# Patient Record
Sex: Female | Born: 1950 | Race: Black or African American | Hispanic: No | Marital: Married | Smoking: Never smoker
Health system: Southern US, Community
[De-identification: ages and names within clinical notes are randomized; demographics above are authoritative.]

## PROBLEM LIST (undated history)

## (undated) DIAGNOSIS — Z9221 Personal history of antineoplastic chemotherapy: Secondary | ICD-10-CM

## (undated) DIAGNOSIS — C801 Malignant (primary) neoplasm, unspecified: Secondary | ICD-10-CM

## (undated) DIAGNOSIS — K219 Gastro-esophageal reflux disease without esophagitis: Secondary | ICD-10-CM

## (undated) DIAGNOSIS — E039 Hypothyroidism, unspecified: Secondary | ICD-10-CM

## (undated) DIAGNOSIS — F419 Anxiety disorder, unspecified: Secondary | ICD-10-CM

## (undated) DIAGNOSIS — Z87442 Personal history of urinary calculi: Secondary | ICD-10-CM

## (undated) DIAGNOSIS — J45909 Unspecified asthma, uncomplicated: Secondary | ICD-10-CM

## (undated) DIAGNOSIS — G4730 Sleep apnea, unspecified: Secondary | ICD-10-CM

## (undated) DIAGNOSIS — E042 Nontoxic multinodular goiter: Secondary | ICD-10-CM

## (undated) DIAGNOSIS — D649 Anemia, unspecified: Secondary | ICD-10-CM

## (undated) DIAGNOSIS — Z5189 Encounter for other specified aftercare: Secondary | ICD-10-CM

## (undated) DIAGNOSIS — C50919 Malignant neoplasm of unspecified site of unspecified female breast: Secondary | ICD-10-CM

## (undated) DIAGNOSIS — I10 Essential (primary) hypertension: Secondary | ICD-10-CM

## (undated) HISTORY — PX: CHOLECYSTECTOMY: SHX55

## (undated) HISTORY — DX: Encounter for other specified aftercare: Z51.89

## (undated) HISTORY — PX: LAPAROSCOPIC GASTRIC SLEEVE RESECTION: SHX5895

## (undated) HISTORY — PX: COLONOSCOPY: SHX174

## (undated) HISTORY — PX: ESOPHAGOGASTRODUODENOSCOPY: SHX1529

## (undated) HISTORY — PX: BREAST REDUCTION SURGERY: SHX8

## (undated) HISTORY — DX: Sleep apnea, unspecified: G47.30

## (undated) HISTORY — PX: PORTACATH PLACEMENT: SHX2246

## (undated) HISTORY — DX: Anemia, unspecified: D64.9

## (undated) HISTORY — PX: THYROIDECTOMY, PARTIAL: SHX18

## (undated) HISTORY — PX: UPPER GASTROINTESTINAL ENDOSCOPY: SHX188

## (undated) HISTORY — DX: Anxiety disorder, unspecified: F41.9

## (undated) HISTORY — DX: Essential (primary) hypertension: I10

## (undated) HISTORY — DX: Malignant neoplasm of unspecified site of unspecified female breast: C50.919

## (undated) HISTORY — PX: ABDOMINAL HYSTERECTOMY: SHX81

## (undated) HISTORY — PX: GANGLION CYST EXCISION: SHX1691

## (undated) HISTORY — PX: AXILLARY LYMPH NODE DISSECTION: SHX5229

## (undated) HISTORY — PX: OTHER SURGICAL HISTORY: SHX169

## (undated) HISTORY — PX: HERNIA REPAIR: SHX51

## (undated) HISTORY — DX: Malignant (primary) neoplasm, unspecified: C80.1

---

## 2016-09-11 DIAGNOSIS — G8191 Hemiplegia, unspecified affecting right dominant side: Secondary | ICD-10-CM | POA: Diagnosis not present

## 2016-09-11 DIAGNOSIS — E039 Hypothyroidism, unspecified: Secondary | ICD-10-CM | POA: Diagnosis not present

## 2016-09-11 DIAGNOSIS — I10 Essential (primary) hypertension: Secondary | ICD-10-CM | POA: Diagnosis not present

## 2016-09-11 DIAGNOSIS — C50019 Malignant neoplasm of nipple and areola, unspecified female breast: Secondary | ICD-10-CM | POA: Diagnosis not present

## 2016-09-11 DIAGNOSIS — M130 Polyarthritis, unspecified: Secondary | ICD-10-CM | POA: Diagnosis not present

## 2016-09-11 DIAGNOSIS — M818 Other osteoporosis without current pathological fracture: Secondary | ICD-10-CM | POA: Diagnosis not present

## 2016-09-12 DIAGNOSIS — M818 Other osteoporosis without current pathological fracture: Secondary | ICD-10-CM | POA: Diagnosis not present

## 2016-09-12 DIAGNOSIS — C50019 Malignant neoplasm of nipple and areola, unspecified female breast: Secondary | ICD-10-CM | POA: Diagnosis not present

## 2016-09-12 DIAGNOSIS — M130 Polyarthritis, unspecified: Secondary | ICD-10-CM | POA: Diagnosis not present

## 2016-09-12 DIAGNOSIS — I10 Essential (primary) hypertension: Secondary | ICD-10-CM | POA: Diagnosis not present

## 2016-09-12 DIAGNOSIS — E039 Hypothyroidism, unspecified: Secondary | ICD-10-CM | POA: Diagnosis not present

## 2016-09-12 DIAGNOSIS — C8191 Hodgkin lymphoma, unspecified, lymph nodes of head, face, and neck: Secondary | ICD-10-CM | POA: Diagnosis not present

## 2016-09-17 DIAGNOSIS — M818 Other osteoporosis without current pathological fracture: Secondary | ICD-10-CM | POA: Diagnosis not present

## 2016-09-17 DIAGNOSIS — C50019 Malignant neoplasm of nipple and areola, unspecified female breast: Secondary | ICD-10-CM | POA: Diagnosis not present

## 2016-09-17 DIAGNOSIS — I10 Essential (primary) hypertension: Secondary | ICD-10-CM | POA: Diagnosis not present

## 2016-09-17 DIAGNOSIS — E039 Hypothyroidism, unspecified: Secondary | ICD-10-CM | POA: Diagnosis not present

## 2016-09-17 DIAGNOSIS — C8191 Hodgkin lymphoma, unspecified, lymph nodes of head, face, and neck: Secondary | ICD-10-CM | POA: Diagnosis not present

## 2016-09-17 DIAGNOSIS — M130 Polyarthritis, unspecified: Secondary | ICD-10-CM | POA: Diagnosis not present

## 2016-09-19 ENCOUNTER — Telehealth: Payer: Self-pay | Admitting: Hematology and Oncology

## 2016-09-19 NOTE — Telephone Encounter (Signed)
Pt confirmed appt Called [facility]) to obtain [facility] (Dr. Humphrey Rolls) [phone] to request patient records and scans to be faxed [facility]

## 2016-09-20 ENCOUNTER — Other Ambulatory Visit: Payer: Self-pay | Admitting: *Deleted

## 2016-09-20 ENCOUNTER — Ambulatory Visit (HOSPITAL_COMMUNITY)
Admission: RE | Admit: 2016-09-20 | Discharge: 2016-09-20 | Disposition: A | Discharge status: Home / Self Care | Payer: Commercial Managed Care - HMO | Source: Ambulatory Visit | Attending: Hematology and Oncology | Admitting: Hematology and Oncology

## 2016-09-20 ENCOUNTER — Ambulatory Visit (HOSPITAL_BASED_OUTPATIENT_CLINIC_OR_DEPARTMENT_OTHER): Payer: Commercial Managed Care - HMO

## 2016-09-20 ENCOUNTER — Encounter: Payer: Self-pay | Admitting: Hematology and Oncology

## 2016-09-20 ENCOUNTER — Telehealth: Payer: Self-pay | Admitting: Hematology and Oncology

## 2016-09-20 ENCOUNTER — Ambulatory Visit (HOSPITAL_BASED_OUTPATIENT_CLINIC_OR_DEPARTMENT_OTHER): Payer: Commercial Managed Care - HMO | Admitting: Hematology and Oncology

## 2016-09-20 DIAGNOSIS — Z8509 Personal history of malignant neoplasm of other digestive organs: Secondary | ICD-10-CM | POA: Diagnosis not present

## 2016-09-20 DIAGNOSIS — C8198 Hodgkin lymphoma, unspecified, lymph nodes of multiple sites: Secondary | ICD-10-CM

## 2016-09-20 DIAGNOSIS — Z452 Encounter for adjustment and management of vascular access device: Secondary | ICD-10-CM | POA: Insufficient documentation

## 2016-09-20 DIAGNOSIS — Z853 Personal history of malignant neoplasm of breast: Secondary | ICD-10-CM

## 2016-09-20 DIAGNOSIS — Z9689 Presence of other specified functional implants: Secondary | ICD-10-CM | POA: Diagnosis not present

## 2016-09-20 LAB — CBC WITH DIFFERENTIAL/PLATELET
BASO%: 0.4 % (ref 0.0–2.0)
BASOS ABS: 0 10*3/uL (ref 0.0–0.1)
EOS ABS: 0.1 10*3/uL (ref 0.0–0.5)
EOS%: 1.9 % (ref 0.0–7.0)
HCT: 35.7 % (ref 34.8–46.6)
HEMOGLOBIN: 11.6 g/dL (ref 11.6–15.9)
LYMPH%: 27.4 % (ref 14.0–49.7)
MCH: 30.3 pg (ref 25.1–34.0)
MCHC: 32.5 g/dL (ref 31.5–36.0)
MCV: 93.2 fL (ref 79.5–101.0)
MONO#: 0.4 10*3/uL (ref 0.1–0.9)
MONO%: 6.3 % (ref 0.0–14.0)
NEUT#: 4.5 10*3/uL (ref 1.5–6.5)
NEUT%: 64 % (ref 38.4–76.8)
Platelets: 286 10*3/uL (ref 145–400)
RBC: 3.83 10*6/uL (ref 3.70–5.45)
RDW: 13.2 % (ref 11.2–14.5)
WBC: 7 10*3/uL (ref 3.9–10.3)
lymph#: 1.9 10*3/uL (ref 0.9–3.3)

## 2016-09-20 LAB — COMPREHENSIVE METABOLIC PANEL
ALBUMIN: 3.9 g/dL (ref 3.5–5.0)
ALT: 10 U/L (ref 0–55)
ANION GAP: 8 meq/L (ref 3–11)
AST: 18 U/L (ref 5–34)
Alkaline Phosphatase: 104 U/L (ref 40–150)
BILIRUBIN TOTAL: 0.73 mg/dL (ref 0.20–1.20)
BUN: 11.8 mg/dL (ref 7.0–26.0)
CALCIUM: 9.3 mg/dL (ref 8.4–10.4)
CHLORIDE: 107 meq/L (ref 98–109)
CO2: 26 meq/L (ref 22–29)
CREATININE: 0.7 mg/dL (ref 0.6–1.1)
EGFR: >90 mL/min/{1.73_m2} (ref >90)
Glucose: 110 mg/dl (ref 70–140)
Potassium: 3.5 mEq/L (ref 3.5–5.1)
SODIUM: 142 meq/L (ref 136–145)
Total Protein: 7.4 g/dL (ref 6.4–8.3)

## 2016-09-20 LAB — LACTATE DEHYDROGENASE: LDH: 198 U/L (ref 125–245)

## 2016-09-20 MED ORDER — HEPARIN SOD (PORK) LOCK FLUSH 100 UNIT/ML IV SOLN
500.0000 [IU] | Freq: Once | INTRAVENOUS | Status: AC
  Administered 2016-09-20: 500 [IU] via INTRAVENOUS
  Filled 2016-09-20: qty 5

## 2016-09-20 MED ORDER — LIDOCAINE-PRILOCAINE 2.5-2.5 % EX CREA
TOPICAL_CREAM | CUTANEOUS | Status: AC
  Filled 2016-09-20: qty 5

## 2016-09-20 MED ORDER — SODIUM CHLORIDE 0.9% FLUSH
10.0000 mL | INTRAVENOUS | Status: DC | PRN
  Administered 2016-09-20: 10 mL via INTRAVENOUS
  Filled 2016-09-20: qty 10

## 2016-09-20 NOTE — Telephone Encounter (Signed)
2 bottles of contrast given per CT orders. Appointments scheduled per 09/20/16 los. Patient was given a copy of the appointment schedule and AVS report, per 09/20/16 los.

## 2016-09-24 ENCOUNTER — Encounter: Payer: Self-pay | Admitting: Hematology and Oncology

## 2016-09-24 DIAGNOSIS — Z853 Personal history of malignant neoplasm of breast: Secondary | ICD-10-CM | POA: Insufficient documentation

## 2016-09-24 DIAGNOSIS — Z8509 Personal history of malignant neoplasm of other digestive organs: Secondary | ICD-10-CM | POA: Insufficient documentation

## 2016-09-24 NOTE — Assessment & Plan Note (Addendum)
She had history of GIST tumor, status post submucosal resection in 2014 when EGD show mucosa nodule in the proximal stomach. She should undergo surveillance EGD in the future even if CT scan is negative

## 2016-09-24 NOTE — Assessment & Plan Note (Signed)
Clinically, she has no signs of cancer recurrence. I plan to order a CT scan of the chest, abdomen and pelvis to exclude persistent disease. If she is cancer free, I will get the port removed. We will draw baseline blood work and flush her port today.

## 2016-09-24 NOTE — Progress Notes (Signed)
[facility] CONSULT NOTE  Patient Care Team: Lucianne Lei, MD as PCP - General (Family Medicine)  CHIEF COMPLAINTS/PURPOSE OF CONSULTATION:  History of Hodgkin lymphoma, breast cancer and GIST tumor  HISTORY OF PRESENTING ILLNESS:  Andrea Mendez 65 y.o. female is here because of multiple cancer history. 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 Staging scans showed adenopathy in the left internal jugular chain, mediastinum, peritoneum, moderate pericardial effusion along with cardiomegaly      03/01/2013 - 08/19/2013 Chemotherapy    The patient had 6 cycles of ABVD in [place]      01/31/2014 Imaging    Outside CT scan of the chest, abdomen and pelvis showed reduction in size of lymphadenopathy      03/03/2014 Imaging    Outside CT that showed no evidence of lymphadenopathy, 6 mm left thyroid nodule and postoperative partial resection of right lobe of thyroid      06/07/2014 PET scan    Outside PET scan showed no evidence of cancer recurrence      09/29/2014 Imaging    Outside CT scan of the neck, chest, abdomen and pelvis showed reduction in the size of mediastinal lymph nodes and stable sclerotic focus in L1 vertebral body      02/01/2015 PET scan    Outside PET scan show no evidence of disease      08/09/2015 PET scan    Outside PET scan showed no evidence of disease      11/16/2015 Imaging    Outside screening left breast mammogram showed asymmetric density in the upper inner left breast, spot compression view recommended ultrasound      12/04/2015 Imaging    Outside ultrasound of the left breast showed no masses.      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 The patient relocated to [place] in 2017 to be closer to family. Prior to that, she has lived in [place] for 21 years. Her cancer history started when she was 31 when she had palpated a lump on the left breast leading to mastectomy, chemotherapy and anti-estrogen therapy. The patient was pre-menopausal at the time of diagnosis of breast cancer. She had menarche at the age of 54, had her first child when she was 52 years old, became menopausal at age 46. She had been exposed to birth control pills for approximately 10 years in total. She had 3 children but did not breast-feed any of her children. She has regular surveillance screening mammogram on the left breast. She tested negative for BRCA mutation in 2016. In terms of her GIST tumor diagnosis, it was discovered incidentally on EGD. In terms of Hodgkin lymphoma, the patient had profound weight loss in 2014 with associated skin itching. She underwent further evaluation and was subsequently diagnosed with stage III Hodgkin lymphoma. The chemotherapy treatment was complicated by some mild peripheral neuropathy and pancytopenia requiring blood transfusion. She still has a port in place She feels well. No recent lymphadenopathy.  Appetite is stable. No further weight loss.  MEDICAL HISTORY:  Past Medical History:  Diagnosis Date  . Anemia   . Anxiety   . Blood transfusion without reported diagnosis   . Breast cancer ([place])    right breast  . Cancer ([place])    Hodgkin lymphoma  . Hypertension     SURGICAL HISTORY: Past Surgical History:  Procedure Laterality Date  . ABDOMINAL HYSTERECTOMY    . APPENDECTOMY    . AXILLARY LYMPH NODE DISSECTION Right    1990  . BREAST REDUCTION SURGERY Left   . COLONOSCOPY    . ESOPHAGOGASTRODUODENOSCOPY    . LAPAROSCOPIC  GASTRIC SLEEVE RESECTION    . right mastectomy Right    1990  . THYROIDECTOMY, PARTIAL      SOCIAL HISTORY: Social History   Social History  . Marital status: Married    Spouse name: N/A  . Number of children: 3  . Years of education: N/A   Occupational History  . retired [employer]    Social History Main Topics  . Smoking status: Never Smoker  . Smokeless tobacco: Never Used  . Alcohol use 3.0 oz/week    5 Glasses of wine per week  . Drug use: No  . Sexual activity: Not on file   Other Topics Concern  . Not on file   Social History Narrative  . No narrative on file    FAMILY HISTORY: Family History  Problem Relation Age of Onset  . Cancer Mother 31    colon ca  . Cancer Father 39    prostate ca    ALLERGIES:  has No Known Allergies.  MEDICATIONS:  Current Outpatient Prescriptions  Medication Sig Dispense Refill  . alendronate (FOSAMAX) 35 MG tablet Take 35 mg by mouth every 7 (seven) days. Take with a full glass of water on an empty stomach.    . ALPRAZolam (XANAX) 0.25 MG tablet Take 0.25 mg by mouth at bedtime as needed for anxiety.    Marland Kitchen amLODipine (NORVASC) 10 MG tablet     . Calcium Carb-Cholecalciferol (CALCIUM 1000 + D) 1000-800 MG-UNIT TABS Take by mouth.    . cholecalciferol (VITAMIN D) 1000 units tablet Take 1,000 Units by mouth daily.    Marland Kitchen levothyroxine (SYNTHROID, LEVOTHROID) 100 MCG tablet     . Melatonin 3 MG CAPS Take by mouth.    . metoprolol (LOPRESSOR) 50 MG tablet     . Multiple Vitamin (MULTIVITAMIN) tablet Take 1 tablet by mouth daily.     No current facility-administered medications for this visit.     REVIEW OF SYSTEMS:   Constitutional: Denies fevers, chills or abnormal night sweats Eyes: Denies blurriness of vision, double vision or watery eyes Ears, nose, mouth, throat, and face: Denies mucositis or sore throat Respiratory: Denies cough, dyspnea or wheezes Cardiovascular: Denies palpitation, chest discomfort or lower  extremity swelling Gastrointestinal:  Denies nausea, heartburn or change in bowel habits Skin: Denies abnormal skin rashes Lymphatics: Denies new lymphadenopathy or easy bruising Neurological:Denies numbness, tingling or new weaknesses Behavioral/Psych: Mood is stable, no new changes  All other systems were reviewed with the patient and are negative.  PHYSICAL EXAMINATION: ECOG PERFORMANCE STATUS: 1 - Symptomatic but completely ambulatory  Vitals:   09/20/16 1029  BP: (!) 144/74  Pulse: 99  Resp: 19  Temp: 97.9 F (36.6 C)   Filed Weights   09/20/16 1029  Weight: 207 lb 11.2 oz (94.2 kg)    GENERAL:alert, no distress and comfortable SKIN:  skin color, texture, turgor are normal, no rashes or significant lesions EYES: normal, conjunctiva are pink and non-injected, sclera clear OROPHARYNX:no exudate, no erythema and lips, buccal mucosa, and tongue normal  NECK: supple, No mild thyroid enlargement. Well-healed surgical scar  LYMPH:  no palpable lymphadenopathy in the cervical, axillary or inguinal LUNGS: clear to auscultation and percussion with normal breathing effort HEART: regular rate & rhythm and no murmurs and no lower extremity edema ABDOMEN:abdomen soft, non-tender and normal bowel sounds Musculoskeletal:no cyanosis of digits and no clubbing  PSYCH: alert & oriented x 3 with fluent speech NEURO: no focal motor/sensory deficits Chest wall examination revealed right mastectomy scar. No abnormalities on exam on either chest wall  LABORATORY DATA:  I have reviewed the data as listed Lab Results  Component Value Date   WBC 7.0 09/20/2016   HGB 11.6 09/20/2016   HCT 35.7 09/20/2016   MCV 93.2 09/20/2016   PLT 286 09/20/2016    Recent Labs  09/20/16 1205  NA 142  K 3.5  CO2 26  GLUCOSE 110  BUN 11.8  CREATININE 0.7  CALCIUM 9.3  PROT 7.4  ALBUMIN 3.9  AST 18  ALT 10  ALKPHOS 104  BILITOT 0.73    RADIOGRAPHIC STUDIES: I have personally reviewed the  radiological images as listed and agreed with the findings in the report. Dg Chest 1 View  Result Date: 09/20/2016 CLINICAL DATA:  Port-A-Cath placement positioning. EXAM: CHEST 1 VIEW COMPARISON:  None. FINDINGS: Injectable port is seen within the right chest wall with tip at the expected location of the cavoatrial junction. Cardiomediastinal silhouette is normal. Mediastinal contours appear intact. There is no evidence of focal airspace consolidation, pleural effusion or pneumothorax. Soft tissue densities project at the right cardio phrenic angle and right peritracheal location slightly above the carina. Osseous structures are without acute abnormality. Soft tissues are grossly normal. IMPRESSION: Injectable port with tip at the expected location of the cavoatrial junction. Soft tissue densities project at the right cardiophrenic angle and right peritracheal location. These may represent overlapping shadows on this single frontal view of the chest, however pulmonary masses or lymphadenopathy cannot be excluded. Electronically Signed   By: Fidela Salisbury M.D.   On: 09/20/2016 12:20    ASSESSMENT & PLAN:  Hodgkin lymphoma of lymph nodes of multiple sites [facility]) Clinically, she has no signs of cancer recurrence. I plan to order a CT scan of the chest, abdomen and pelvis to exclude persistent disease. If she is cancer free, I will get the port removed. We will draw baseline blood work and flush her port today.  History of right breast cancer The patient have a remote history of right breast cancer status post mastectomy in 1990, adjuvant chemotherapy, and tamoxifen She was premenopausal at diagnosis. Subsequently, she has hysterectomy and bilateral salpingo-oophorectomy. She had BRCA genetic testing in 2016 with negative results. She will continue screening mammogram on the left breast  History of gastrointestinal stromal tumor (GIST) She had history of GIST tumor, status post submucosal  resection in 2014 when EGD show mucosa nodule in the proximal stomach. She should undergo surveillance EGD in the future even if CT scan is negative  Orders Placed This Encounter  Procedures  . CT ABDOMEN PELVIS W CONTRAST    Standing Status:   Future    Standing Expiration Date:   10/25/2017    Order Specific Question:   Reason for exam:    Answer:   staging lymphoma, exclude recurrence  Order Specific Question:   Preferred imaging location?    Answer:   [facility]    Standing Status:   Future    Standing Expiration Date:   10/25/2017    Scheduling Instructions:     Call cell phone ifrst    Order Specific Question:   Reason for exam:    Answer:   staging lymphoma, exclude recurrence    Order Specific Question:   Preferred imaging location?    Answer:   [facility]  . DG Chest 1 View    Standing Status:   Future    Number of Occurrences:   1    Standing Expiration Date:   09/20/2017    Order Specific Question:   Reason for Exam (SYMPTOM  OR DIAGNOSIS REQUIRED)    Answer:   confirm position of port    Order Specific Question:   Preferred imaging location?    Answer:   [facility]  . CBC with Differential/Platelet    Standing Status:   Standing    Number of Occurrences:   3    Standing Expiration Date:   09/20/2017  . Comprehensive metabolic panel    Standing Status:   Standing    Number of Occurrences:   3    Standing Expiration Date:   09/20/2017  . Lactate dehydrogenase    Standing Status:   Standing    Number of Occurrences:   3    Standing Expiration Date:   09/20/2017     All questions were answered. The patient knows to call the clinic with any problems, questions or concerns. I spent 60 minutes counseling the patient face to face. The total time spent in the appointment was 80 minutes and more than 50% was on counseling.     Heath Lark, MD 09/24/2016 8:13 AM

## 2016-09-24 NOTE — Assessment & Plan Note (Addendum)
The patient have a remote history of right breast cancer status post mastectomy in 1990, adjuvant chemotherapy, and tamoxifen She was premenopausal at diagnosis. Subsequently, she has hysterectomy and bilateral salpingo-oophorectomy. She had BRCA genetic testing in 2016 with negative results. She will continue screening mammogram on the left breast

## 2016-10-03 ENCOUNTER — Other Ambulatory Visit: Payer: Self-pay | Admitting: Internal Medicine

## 2016-10-03 DIAGNOSIS — E041 Nontoxic single thyroid nodule: Secondary | ICD-10-CM

## 2016-10-03 DIAGNOSIS — E039 Hypothyroidism, unspecified: Secondary | ICD-10-CM | POA: Diagnosis not present

## 2016-10-04 ENCOUNTER — Telehealth: Payer: Self-pay | Admitting: *Deleted

## 2016-10-04 ENCOUNTER — Encounter (HOSPITAL_COMMUNITY): Payer: Self-pay

## 2016-10-04 ENCOUNTER — Ambulatory Visit (HOSPITAL_COMMUNITY)
Admission: RE | Admit: 2016-10-04 | Discharge: 2016-10-04 | Disposition: A | Discharge status: Home / Self Care | Payer: Commercial Managed Care - HMO | Source: Ambulatory Visit | Attending: Hematology and Oncology | Admitting: Hematology and Oncology

## 2016-10-04 DIAGNOSIS — Z9049 Acquired absence of other specified parts of digestive tract: Secondary | ICD-10-CM | POA: Diagnosis not present

## 2016-10-04 DIAGNOSIS — Z8500 Personal history of malignant neoplasm of unspecified digestive organ: Secondary | ICD-10-CM | POA: Insufficient documentation

## 2016-10-04 DIAGNOSIS — C8198 Hodgkin lymphoma, unspecified, lymph nodes of multiple sites: Secondary | ICD-10-CM | POA: Insufficient documentation

## 2016-10-04 DIAGNOSIS — M899 Disorder of bone, unspecified: Secondary | ICD-10-CM | POA: Insufficient documentation

## 2016-10-04 DIAGNOSIS — Z9889 Other specified postprocedural states: Secondary | ICD-10-CM | POA: Insufficient documentation

## 2016-10-04 DIAGNOSIS — I700 Atherosclerosis of aorta: Secondary | ICD-10-CM | POA: Insufficient documentation

## 2016-10-04 DIAGNOSIS — Z90710 Acquired absence of both cervix and uterus: Secondary | ICD-10-CM | POA: Insufficient documentation

## 2016-10-04 DIAGNOSIS — C8590 Non-Hodgkin lymphoma, unspecified, unspecified site: Secondary | ICD-10-CM | POA: Diagnosis not present

## 2016-10-04 MED ORDER — IOPAMIDOL (ISOVUE-300) INJECTION 61%
100.0000 mL | Freq: Once | INTRAVENOUS | Status: AC | PRN
  Administered 2016-10-04: 100 mL via INTRAVENOUS

## 2016-10-04 NOTE — Telephone Encounter (Signed)
-----   Message from Heath Lark, MD sent at 10/04/2016  3:43 PM EST ----- Regarding: CT report Can you call her and let her know CT showed no evidence of cancer Would she be OK for her port to be removed? If so, we need to arrange this with IR (it was placed in [facility])

## 2016-10-04 NOTE — Telephone Encounter (Signed)
Pt notified of CT scan results. Wants to have port removed.  LM for IR for removal

## 2016-10-07 ENCOUNTER — Telehealth: Payer: Self-pay | Admitting: *Deleted

## 2016-10-07 DIAGNOSIS — C8198 Hodgkin lymphoma, unspecified, lymph nodes of multiple sites: Secondary | ICD-10-CM

## 2016-10-07 NOTE — Telephone Encounter (Signed)
VM from [facility] in IR states needs Order for [facility] removal entered for IR.  Order placed.

## 2016-10-08 DIAGNOSIS — I10 Essential (primary) hypertension: Secondary | ICD-10-CM | POA: Diagnosis not present

## 2016-10-08 DIAGNOSIS — M818 Other osteoporosis without current pathological fracture: Secondary | ICD-10-CM | POA: Diagnosis not present

## 2016-10-08 DIAGNOSIS — G8191 Hemiplegia, unspecified affecting right dominant side: Secondary | ICD-10-CM | POA: Diagnosis not present

## 2016-10-08 DIAGNOSIS — M130 Polyarthritis, unspecified: Secondary | ICD-10-CM | POA: Diagnosis not present

## 2016-10-11 ENCOUNTER — Other Ambulatory Visit: Payer: Self-pay | Admitting: Radiology

## 2016-10-14 ENCOUNTER — Ambulatory Visit (HOSPITAL_COMMUNITY)
Admission: RE | Admit: 2016-10-14 | Discharge: 2016-10-14 | Disposition: A | Discharge status: Home / Self Care | Payer: Commercial Managed Care - HMO | Source: Ambulatory Visit | Attending: Hematology and Oncology | Admitting: Hematology and Oncology

## 2016-10-14 ENCOUNTER — Encounter (HOSPITAL_COMMUNITY): Payer: Self-pay | Admitting: *Deleted

## 2016-10-14 DIAGNOSIS — Z800 Family history of malignant neoplasm of digestive organs: Secondary | ICD-10-CM | POA: Insufficient documentation

## 2016-10-14 DIAGNOSIS — Z452 Encounter for adjustment and management of vascular access device: Secondary | ICD-10-CM | POA: Diagnosis not present

## 2016-10-14 DIAGNOSIS — Z9884 Bariatric surgery status: Secondary | ICD-10-CM | POA: Insufficient documentation

## 2016-10-14 DIAGNOSIS — Z9221 Personal history of antineoplastic chemotherapy: Secondary | ICD-10-CM | POA: Insufficient documentation

## 2016-10-14 DIAGNOSIS — Z8042 Family history of malignant neoplasm of prostate: Secondary | ICD-10-CM | POA: Insufficient documentation

## 2016-10-14 DIAGNOSIS — I10 Essential (primary) hypertension: Secondary | ICD-10-CM | POA: Insufficient documentation

## 2016-10-14 DIAGNOSIS — Z8572 Personal history of non-Hodgkin lymphomas: Secondary | ICD-10-CM | POA: Insufficient documentation

## 2016-10-14 DIAGNOSIS — Z853 Personal history of malignant neoplasm of breast: Secondary | ICD-10-CM | POA: Insufficient documentation

## 2016-10-14 DIAGNOSIS — F419 Anxiety disorder, unspecified: Secondary | ICD-10-CM | POA: Insufficient documentation

## 2016-10-14 DIAGNOSIS — D649 Anemia, unspecified: Secondary | ICD-10-CM | POA: Insufficient documentation

## 2016-10-14 DIAGNOSIS — C8198 Hodgkin lymphoma, unspecified, lymph nodes of multiple sites: Secondary | ICD-10-CM

## 2016-10-14 HISTORY — PX: IR GENERIC HISTORICAL: IMG1180011

## 2016-10-14 HISTORY — DX: Personal history of antineoplastic chemotherapy: Z92.21

## 2016-10-14 LAB — CBC WITH DIFFERENTIAL/PLATELET
Basophils Absolute: 0 10*3/uL (ref 0.0–0.1)
Basophils Relative: 0 %
EOS PCT: 2 %
Eosinophils Absolute: 0.2 10*3/uL (ref 0.0–0.7)
HEMATOCRIT: 35.8 % — AB (ref 36.0–46.0)
Hemoglobin: 11.8 g/dL — ABNORMAL LOW (ref 12.0–15.0)
LYMPHS ABS: 2.4 10*3/uL (ref 0.7–4.0)
LYMPHS PCT: 30 %
MCH: 30.9 pg (ref 26.0–34.0)
MCHC: 33 g/dL (ref 30.0–36.0)
MCV: 93.7 fL (ref 78.0–100.0)
MONO ABS: 0.5 10*3/uL (ref 0.1–1.0)
MONOS PCT: 6 %
NEUTROS ABS: 4.8 10*3/uL (ref 1.7–7.7)
Neutrophils Relative %: 62 %
PLATELETS: 348 10*3/uL (ref 150–400)
RBC: 3.82 MIL/uL — ABNORMAL LOW (ref 3.87–5.11)
RDW: 13.4 % (ref 11.5–15.5)
WBC: 7.9 10*3/uL (ref 4.0–10.5)

## 2016-10-14 LAB — PROTIME-INR
INR: 0.97
Prothrombin Time: 12.9 seconds (ref 11.4–15.2)

## 2016-10-14 MED ORDER — LIDOCAINE-EPINEPHRINE (PF) 2 %-1:200000 IJ SOLN
INTRAMUSCULAR | Status: AC | PRN
  Administered 2016-10-14: 20 mL

## 2016-10-14 MED ORDER — FENTANYL CITRATE (PF) 100 MCG/2ML IJ SOLN
INTRAMUSCULAR | Status: AC | PRN
  Administered 2016-10-14: 50 ug via INTRAVENOUS
  Administered 2016-10-14: 25 ug via INTRAVENOUS

## 2016-10-14 MED ORDER — LIDOCAINE-EPINEPHRINE (PF) 2 %-1:200000 IJ SOLN
INTRAMUSCULAR | Status: AC
  Filled 2016-10-14: qty 20

## 2016-10-14 MED ORDER — MIDAZOLAM HCL 2 MG/2ML IJ SOLN
INTRAMUSCULAR | Status: AC
  Filled 2016-10-14: qty 6

## 2016-10-14 MED ORDER — CEFAZOLIN SODIUM-DEXTROSE 2-4 GM/100ML-% IV SOLN
2.0000 g | INTRAVENOUS | Status: DC
Start: 2016-10-14 — End: 2016-10-15
  Filled 2016-10-14: qty 100

## 2016-10-14 MED ORDER — SODIUM CHLORIDE 0.9 % IV SOLN
INTRAVENOUS | Status: DC
  Administered 2016-10-14: 12:00:00 via INTRAVENOUS

## 2016-10-14 MED ORDER — MIDAZOLAM HCL 2 MG/2ML IJ SOLN
INTRAMUSCULAR | Status: AC | PRN
  Administered 2016-10-14: 1 mg via INTRAVENOUS
  Administered 2016-10-14: 0.5 mg via INTRAVENOUS

## 2016-10-14 MED ORDER — FENTANYL CITRATE (PF) 100 MCG/2ML IJ SOLN
INTRAMUSCULAR | Status: AC
  Filled 2016-10-14: qty 4

## 2016-10-14 NOTE — Procedures (Signed)
R port removal No complication No blood loss. See complete dictation in [facility].

## 2016-10-14 NOTE — Consult Note (Signed)
Chief Complaint: Patient was seen in consultation today for port a cath removal  Referring Physician(s): Gorsuch,Ni  Supervising Physician: Arne Cleveland  Patient Status: [place]  History of Present Illness: Andrea Mendez is a 66 y.o. female with remote hx right breast cancer 1990's, GIST with prior resection 2014 and most recently Hodgkin's lymphoma 2014. She had a right chest wall port a cath placed in [place] several years ago. Her most recent CT revealed no evidence of recurrent disease. She presents today for port a cath removal.   Past Medical History:  Diagnosis Date  . Anemia   . Anxiety   . Blood transfusion without reported diagnosis   . Breast cancer ([place])    right breast  . Cancer ([place])    Hodgkin lymphoma  . History of chemotherapy   . Hypertension     Past Surgical History:  Procedure Laterality Date  . ABDOMINAL HYSTERECTOMY    . APPENDECTOMY    . AXILLARY LYMPH NODE DISSECTION Right    1990  . BREAST REDUCTION SURGERY Left   . CHOLECYSTECTOMY    . COLONOSCOPY    . ESOPHAGOGASTRODUODENOSCOPY    . LAPAROSCOPIC GASTRIC SLEEVE RESECTION    . PORTACATH PLACEMENT     2014  . right mastectomy Right    1990  . THYROIDECTOMY, PARTIAL      Allergies: Patient has no known allergies.  Medications: Prior to Admission medications   Medication Sig Start Date End Date Taking? Authorizing Provider  alendronate (FOSAMAX) 35 MG tablet Take 35 mg by mouth every 7 (seven) days. Take with a full glass of water on an empty stomach.   Yes Historical Provider, MD  ALPRAZolam Duanne Moron) 0.25 MG tablet Take 0.25 mg by mouth at bedtime as needed for anxiety.   Yes Historical Provider, MD  amLODipine (NORVASC) 10 MG tablet  07/03/16  Yes Historical Provider, MD  Calcium Carb-Cholecalciferol (CALCIUM 1000 + D) 1000-800 MG-UNIT TABS Take by mouth.   Yes Historical Provider, MD  cholecalciferol (VITAMIN D) 1000 units tablet Take 1,000 Units by mouth daily.   Yes  Historical Provider, MD  diclofenac sodium (VOLTAREN) 1 % GEL Apply 2 g topically 4 (four) times daily as needed.   Yes Historical Provider, MD  levothyroxine (SYNTHROID, LEVOTHROID) 100 MCG tablet  07/03/16  Yes Historical Provider, MD  Melatonin 3 MG CAPS Take by mouth.   Yes Historical Provider, MD  metoprolol (LOPRESSOR) 50 MG tablet  07/03/16  Yes Historical Provider, MD  Multiple Vitamin (MULTIVITAMIN) tablet Take 1 tablet by mouth daily.   Yes Historical Provider, MD     Family History  Problem Relation Age of Onset  . Cancer Mother 43    colon ca  . Cancer Father 62    prostate ca    Social History   Social History  . Marital status: Married    Spouse name: N/A  . Number of children: 3  . Years of education: N/A   Occupational History  . retired [employer]    Social History Main Topics  . Smoking status: Never Smoker  . Smokeless tobacco: Never Used  . Alcohol use 3.0 oz/week    5 Glasses of wine per week     Comment: occas glass of wine  . Drug use: No  . Sexual activity: Not Asked   Other Topics Concern  . None   Social History Narrative  . None      Review of Systems :denies fever, HA,CP,dyspnea,  cough, abd/back pain,N/V or bleeding  Vital Signs: BP 140/85 (BP Location: Left Arm)   Pulse 85   Temp 97.8 F (36.6 C) (Oral)   Resp 18   Ht 5\' 3"  (1.6 m)   Wt 208 lb (94.3 kg)   SpO2 100%   BMI 36.85 kg/m   Physical Exam awake/alert; chest- CTA bilat; clean, intact,NT rt chest wall PAC;heart- RRR; abd- obese, soft,+BS,NT; LE- no edema  Mallampati Score:     Imaging: Dg Chest 1 View  Result Date: 09/20/2016 CLINICAL DATA:  Port-A-Cath placement positioning. EXAM: CHEST 1 VIEW COMPARISON:  None. FINDINGS: Injectable port is seen within the right chest wall with tip at the expected location of the cavoatrial junction. Cardiomediastinal silhouette is normal. Mediastinal contours appear intact. There is no evidence of focal airspace consolidation,  pleural effusion or pneumothorax. Soft tissue densities project at the right cardio phrenic angle and right peritracheal location slightly above the carina. Osseous structures are without acute abnormality. Soft tissues are grossly normal. IMPRESSION: Injectable port with tip at the expected location of the cavoatrial junction. Soft tissue densities project at the right cardiophrenic angle and right peritracheal location. These may represent overlapping shadows on this single frontal view of the chest, however pulmonary masses or lymphadenopathy cannot be excluded. Electronically Signed   By: Fidela Salisbury M.D.   On: 09/20/2016 12:20   Ct Chest W Contrast  Result Date: 10/04/2016 CLINICAL DATA:  Hodgkin's lymphoma diagnosed in 2014 while living in for the. Subsequent outside PET scans from late 2015 through May of 2017, by report, showed no evidence for recurrent disease. History of breast cancer. 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 Prior cholecystectomy, appendectomy, and hernia repair. EXAM: CT CHEST, ABDOMEN, AND PELVIS WITH CONTRAST TECHNIQUE: Multidetector CT imaging of the chest, abdomen and pelvis was performed following the standard protocol during bolus administration of intravenous contrast. CONTRAST:  120mL ISOVUE-300 IOPAMIDOL (ISOVUE-300) INJECTION 61% COMPARISON:  None. FINDINGS: CT CHEST FINDINGS Cardiovascular: The heart size is normal. No pericardial effusion. Coronary artery calcification is noted. Atherosclerotic calcification is noted in the wall of the thoracic aorta. Right Port-A-Cath tip is positioned in the distal SVC near the junction with the right atrium. Mediastinum/Nodes: No mediastinal lymphadenopathy. There is no hilar lymphadenopathy. The esophagus has normal imaging features. There is no axillary lymphadenopathy. Thyroid gland is incompletely visualized but there are surgical clips in the region of the thyroid bilaterally suggesting  previous thyroid surgery. Lungs/Pleura: Subsegmental atelectasis or linear scarring is seen in the right upper and lower lobes. No suspicious pulmonary nodule or mass. No focal airspace consolidation. No pulmonary edema or pleural effusion. Musculoskeletal: Patient is status post right breast reconstruction and surgical clips are noted in the right axilla. Bone windows reveal no worrisome lytic osseous lesions. 10 mm sclerotic focus is identified in the right T11 pedicle. CT ABDOMEN PELVIS FINDINGS Hepatobiliary: No focal abnormality within the liver parenchyma. Gallbladder surgically absent. No intrahepatic or extrahepatic biliary dilation. Pancreas: No focal mass lesion. No dilatation of the main duct. No intraparenchymal cyst. No peripancreatic edema. Spleen: No splenomegaly. No focal mass lesion. Adrenals/Urinary Tract: No adrenal nodule or mass. Mild areas of cortical scarring are seen in the kidneys bilaterally without focal renal mass on either side. No evidence for hydroureter. The urinary bladder appears normal for the degree of distention. Stomach/Bowel: Surgical changes are noted in the stomach with associated tiny hiatal hernia. Stomach otherwise unremarkable. Duodenal diverticulum noted. No small bowel wall  thickening. No small bowel dilatation. The terminal ileum is normal. The appendix is normal. Diverticuli are seen scattered along the entire length of the colon without CT findings of diverticulitis. Vascular/Lymphatic: There is abdominal aortic atherosclerosis without aneurysm. There is no gastrohepatic or hepatoduodenal ligament lymphadenopathy. No intraperitoneal or retroperitoneal lymphadenopathy. No pelvic sidewall lymphadenopathy. Reproductive: Uterus surgically absent.  There is no adnexal mass. Other: No intraperitoneal free fluid. Musculoskeletal: Degenerative changes noted lower lumbar spine. Small sclerotic foci in the L1 vertebral body and right L1 pedicle or well-defined. IMPRESSION: 1.  No evidence for lymphadenopathy in the chest, abdomen, or pelvis. No findings to suggest soft tissue metastases in the chest, abdomen, or pelvis. 2. Postsurgical changes in the stomach, possibly related to the history of GI stromal tumor. 3. Despite reported surgical history of appendectomy, the normal appendix is clearly visualized on today's exam. 4. Scattered, well-defined, small sclerotic lesions in the thoracic and lumbar spine. These are likely benign and related to bone islands, but without comparative imaging, sclerotic metastases are not entirely excluded. If warranted, nuclear medicine bone scan to confirm lack of active bony turnover at these sites may prove helpful. 5.  Abdominal Aortic Atherosclerois (ICD10-170.0) 6. Status post hysterectomy. Electronically Signed   By: Misty Stanley M.D.   On: 10/04/2016 09:44   Ct Abdomen Pelvis W Contrast  Result Date: 10/04/2016 CLINICAL DATA:  Hodgkin's lymphoma diagnosed in 2014 while living in for the. Subsequent outside PET scans from late 2015 through May of 2017, by report, showed no evidence for recurrent disease. History of breast cancer. 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 Prior cholecystectomy, appendectomy, and hernia repair. EXAM: CT CHEST, ABDOMEN, AND PELVIS WITH CONTRAST TECHNIQUE: Multidetector CT imaging of the chest, abdomen and pelvis was performed following the standard protocol during bolus administration of intravenous contrast. CONTRAST:  122mL ISOVUE-300 IOPAMIDOL (ISOVUE-300) INJECTION 61% COMPARISON:  None. FINDINGS: CT CHEST FINDINGS Cardiovascular: The heart size is normal. No pericardial effusion. Coronary artery calcification is noted. Atherosclerotic calcification is noted in the wall of the thoracic aorta. Right Port-A-Cath tip is positioned in the distal SVC near the junction with the right atrium. Mediastinum/Nodes: No mediastinal lymphadenopathy. There is no hilar lymphadenopathy. The  esophagus has normal imaging features. There is no axillary lymphadenopathy. Thyroid gland is incompletely visualized but there are surgical clips in the region of the thyroid bilaterally suggesting previous thyroid surgery. Lungs/Pleura: Subsegmental atelectasis or linear scarring is seen in the right upper and lower lobes. No suspicious pulmonary nodule or mass. No focal airspace consolidation. No pulmonary edema or pleural effusion. Musculoskeletal: Patient is status post right breast reconstruction and surgical clips are noted in the right axilla. Bone windows reveal no worrisome lytic osseous lesions. 10 mm sclerotic focus is identified in the right T11 pedicle. CT ABDOMEN PELVIS FINDINGS Hepatobiliary: No focal abnormality within the liver parenchyma. Gallbladder surgically absent. No intrahepatic or extrahepatic biliary dilation. Pancreas: No focal mass lesion. No dilatation of the main duct. No intraparenchymal cyst. No peripancreatic edema. Spleen: No splenomegaly. No focal mass lesion. Adrenals/Urinary Tract: No adrenal nodule or mass. Mild areas of cortical scarring are seen in the kidneys bilaterally without focal renal mass on either side. No evidence for hydroureter. The urinary bladder appears normal for the degree of distention. Stomach/Bowel: Surgical changes are noted in the stomach with associated tiny hiatal hernia. Stomach otherwise unremarkable. Duodenal diverticulum noted. No small bowel wall thickening. No small bowel dilatation. The terminal ileum is normal. The  appendix is normal. Diverticuli are seen scattered along the entire length of the colon without CT findings of diverticulitis. Vascular/Lymphatic: There is abdominal aortic atherosclerosis without aneurysm. There is no gastrohepatic or hepatoduodenal ligament lymphadenopathy. No intraperitoneal or retroperitoneal lymphadenopathy. No pelvic sidewall lymphadenopathy. Reproductive: Uterus surgically absent.  There is no adnexal mass.  Other: No intraperitoneal free fluid. Musculoskeletal: Degenerative changes noted lower lumbar spine. Small sclerotic foci in the L1 vertebral body and right L1 pedicle or well-defined. IMPRESSION: 1. No evidence for lymphadenopathy in the chest, abdomen, or pelvis. No findings to suggest soft tissue metastases in the chest, abdomen, or pelvis. 2. Postsurgical changes in the stomach, possibly related to the history of GI stromal tumor. 3. Despite reported surgical history of appendectomy, the normal appendix is clearly visualized on today's exam. 4. Scattered, well-defined, small sclerotic lesions in the thoracic and lumbar spine. These are likely benign and related to bone islands, but without comparative imaging, sclerotic metastases are not entirely excluded. If warranted, nuclear medicine bone scan to confirm lack of active bony turnover at these sites may prove helpful. 5.  Abdominal Aortic Atherosclerois (ICD10-170.0) 6. Status post hysterectomy. Electronically Signed   By: Misty Stanley M.D.   On: 10/04/2016 09:44    Labs:  CBC:  Recent Labs  09/20/16 1205 10/14/16 1200  WBC 7.0 7.9  HGB 11.6 11.8*  HCT 35.7 35.8*  PLT 286 348    COAGS:  Recent Labs  10/14/16 1200  INR 0.97    BMP:  Recent Labs  09/20/16 1205  NA 142  K 3.5  CO2 26  GLUCOSE 110  BUN 11.8  CALCIUM 9.3  CREATININE 0.7    LIVER FUNCTION TESTS:  Recent Labs  09/20/16 1205  BILITOT 0.73  AST 18  ALT 10  ALKPHOS 104  PROT 7.4  ALBUMIN 3.9    TUMOR MARKERS: No results for input(s): AFPTM, CEA, CA199, CHROMGRNA in the last 8760 hours.  Assessment and Plan: 66 y.o. female with remote hx right breast cancer 1990's, GIST with prior resection 2014 and most recently Hodgkin's lymphoma 2014. She had a right chest wall port a cath placed in [place] several years ago. Her most recent CT revealed no evidence of recurrent disease. She presents today for port a cath removal. Details/risks of procedure,  incl but not limited to, internal bleeding, infection, injury to adjacent structures d/w pt/husband with their understanding and consent.     Thank you for this interesting consult.  I greatly enjoyed meeting Andrea Mendez and look forward to participating in their care.  A copy of this report was sent to the requesting provider on this date.  Electronically Signed: D. Rowe Robert 10/14/2016, 12:54 PM   I spent a total of 20 minutes    in face to face in clinical consultation, greater than 50% of which was counseling/coordinating care for port a cath removal

## 2016-10-14 NOTE — Discharge Instructions (Signed)
Implanted Port Removal, Care After Introduction Refer to this sheet in the next few weeks. These instructions provide you with information about caring for yourself after your procedure. Your health care provider may also give you more specific instructions. Your treatment has been planned according to current medical practices, but problems sometimes occur. Call your health care provider if you have any problems or questions after your procedure. What can I expect after the procedure? After the procedure, it is common to have:  Soreness or pain near your incision.  Some swelling or bruising near your incision. Follow these instructions at home: Medicines  Take over-the-counter and prescription medicines only as told by your health care provider.  If you were prescribed an antibiotic medicine, take it as told by your health care provider. Do not stop taking the antibiotic even if you start to feel better. Bathing  Do not take baths, swim, or use a hot tub until your health care provider approves. Ask your health care provider if you can take showers. You may only be allowed to take sponge baths for bathing. Incision care  Follow instructions from your health care provider about how to take care of your incision. Make sure you:  Wash your hands with soap and water before you change your bandage (dressing). If soap and water are not available, use hand sanitizer.  Change your dressing as told by your health care provider.  Keep your dressing dry.  Leave stitches (sutures), skin glue, or adhesive strips in place. These skin closures may need to stay in place for 2 weeks or longer. If adhesive strip edges start to loosen and curl up, you may trim the loose edges. Do not remove adhesive strips completely unless your health care provider tells you to do that.  Check your incision area every day for signs of infection. Check for:  More redness, swelling, or pain.  More fluid or  blood.  Warmth.  Pus or a bad smell. Driving  If you received a sedative, do not drive for 24 hours after the procedure.  If you did not receive a sedative, ask your health care provider when it is safe to drive. Activity  Return to your normal activities as told by your health care provider. Ask your health care provider what activities are safe for you.  Until your health care provider says it is safe:  Do not lift anything that is heavier than 10 lb (4.5 kg).  Do not do activities that involve lifting your arms over your head. General instructions  Do not use any tobacco products, such as cigarettes, chewing tobacco, and e-cigarettes. Tobacco can delay healing. If you need help quitting, ask your health care provider.  Keep all follow-up visits as told by your health care provider. This is important. Contact a health care provider if:  You have more redness, swelling, or pain around your incision.  You have more fluid or blood coming from your incision.  Your incision feels warm to the touch.  You have pus or a bad smell coming from your incision.  You have a fever.  You have pain that is not relieved by your pain medicine. Get help right away if:  You have chest pain.  You have difficulty breathing. This information is not intended to replace advice given to you by your health care provider. Make sure you discuss any questions you have with your health care provider. Document Released: 08/28/2015 Document Revised: 02/22/2016 Document Reviewed: 06/21/2015  2017 Elsevier  Moderate Conscious Sedation, Adult, Care After These instructions provide you with information about caring for yourself after your procedure. Your health care provider may also give you more specific instructions. Your treatment has been planned according to current medical practices, but problems sometimes occur. Call your health care provider if you have any problems or questions after your  procedure. What can I expect after the procedure? After your procedure, it is common:  To feel sleepy for several hours.  To feel clumsy and have poor balance for several hours.  To have poor judgment for several hours.  To vomit if you eat too soon. Follow these instructions at home: For at least 24 hours after the procedure:   Do not:  Participate in activities where you could fall or become injured.  Drive.  Use heavy machinery.  Drink alcohol.  Take sleeping pills or medicines that cause drowsiness.  Make important decisions or sign legal documents.  Take care of children on your own.  Rest. Eating and drinking  Follow the diet recommended by your health care provider.  If you vomit:  Drink water, juice, or soup when you can drink without vomiting.  Make sure you have little or no nausea before eating solid foods. General instructions  Have a responsible adult stay with you until you are awake and alert.  Take over-the-counter and prescription medicines only as told by your health care provider.  If you smoke, do not smoke without supervision.  Keep all follow-up visits as told by your health care provider. This is important. Contact a health care provider if:  You keep feeling nauseous or you keep vomiting.  You feel light-headed.  You develop a rash.  You have a fever. Get help right away if:  You have trouble breathing. This information is not intended to replace advice given to you by your health care provider. Make sure you discuss any questions you have with your health care provider. Document Released: 07/07/2013 Document Revised: 02/19/2016 Document Reviewed: 01/06/2016 Elsevier Interactive Patient Education  2017 [employer].

## 2016-10-21 DIAGNOSIS — H2513 Age-related nuclear cataract, bilateral: Secondary | ICD-10-CM | POA: Diagnosis not present

## 2016-10-21 DIAGNOSIS — H01022 Squamous blepharitis right lower eyelid: Secondary | ICD-10-CM | POA: Diagnosis not present

## 2016-10-21 DIAGNOSIS — H01024 Squamous blepharitis left upper eyelid: Secondary | ICD-10-CM | POA: Diagnosis not present

## 2016-10-21 DIAGNOSIS — H01025 Squamous blepharitis left lower eyelid: Secondary | ICD-10-CM | POA: Diagnosis not present

## 2016-10-21 DIAGNOSIS — H01021 Squamous blepharitis right upper eyelid: Secondary | ICD-10-CM | POA: Diagnosis not present

## 2016-10-21 DIAGNOSIS — H10413 Chronic giant papillary conjunctivitis, bilateral: Secondary | ICD-10-CM | POA: Diagnosis not present

## 2016-11-12 DIAGNOSIS — N390 Urinary tract infection, site not specified: Secondary | ICD-10-CM | POA: Diagnosis not present

## 2016-11-16 DIAGNOSIS — Z853 Personal history of malignant neoplasm of breast: Secondary | ICD-10-CM | POA: Diagnosis not present

## 2016-11-16 DIAGNOSIS — Z1231 Encounter for screening mammogram for malignant neoplasm of breast: Secondary | ICD-10-CM | POA: Diagnosis not present

## 2016-11-20 DIAGNOSIS — M170 Bilateral primary osteoarthritis of knee: Secondary | ICD-10-CM | POA: Diagnosis not present

## 2016-11-26 DIAGNOSIS — I10 Essential (primary) hypertension: Secondary | ICD-10-CM | POA: Diagnosis not present

## 2016-11-26 DIAGNOSIS — M130 Polyarthritis, unspecified: Secondary | ICD-10-CM | POA: Diagnosis not present

## 2016-11-26 DIAGNOSIS — C8199 Hodgkin lymphoma, unspecified, extranodal and solid organ sites: Secondary | ICD-10-CM | POA: Diagnosis not present

## 2016-11-26 DIAGNOSIS — E039 Hypothyroidism, unspecified: Secondary | ICD-10-CM | POA: Diagnosis not present

## 2016-12-13 DIAGNOSIS — M170 Bilateral primary osteoarthritis of knee: Secondary | ICD-10-CM | POA: Diagnosis not present

## 2016-12-20 DIAGNOSIS — M170 Bilateral primary osteoarthritis of knee: Secondary | ICD-10-CM | POA: Diagnosis not present

## 2016-12-26 DIAGNOSIS — M170 Bilateral primary osteoarthritis of knee: Secondary | ICD-10-CM | POA: Diagnosis not present

## 2017-01-08 DIAGNOSIS — C8199 Hodgkin lymphoma, unspecified, extranodal and solid organ sites: Secondary | ICD-10-CM | POA: Diagnosis not present

## 2017-01-08 DIAGNOSIS — C50019 Malignant neoplasm of nipple and areola, unspecified female breast: Secondary | ICD-10-CM | POA: Diagnosis not present

## 2017-01-08 DIAGNOSIS — E039 Hypothyroidism, unspecified: Secondary | ICD-10-CM | POA: Diagnosis not present

## 2017-01-08 DIAGNOSIS — M130 Polyarthritis, unspecified: Secondary | ICD-10-CM | POA: Diagnosis not present

## 2017-01-08 DIAGNOSIS — M818 Other osteoporosis without current pathological fracture: Secondary | ICD-10-CM | POA: Diagnosis not present

## 2017-01-08 DIAGNOSIS — I10 Essential (primary) hypertension: Secondary | ICD-10-CM | POA: Diagnosis not present

## 2017-01-08 DIAGNOSIS — G8191 Hemiplegia, unspecified affecting right dominant side: Secondary | ICD-10-CM | POA: Diagnosis not present

## 2017-03-20 ENCOUNTER — Telehealth: Payer: Self-pay | Admitting: Hematology and Oncology

## 2017-03-20 ENCOUNTER — Telehealth: Payer: Self-pay | Admitting: Gastroenterology

## 2017-03-20 ENCOUNTER — Other Ambulatory Visit (HOSPITAL_BASED_OUTPATIENT_CLINIC_OR_DEPARTMENT_OTHER): Payer: Medicare HMO

## 2017-03-20 ENCOUNTER — Ambulatory Visit (HOSPITAL_BASED_OUTPATIENT_CLINIC_OR_DEPARTMENT_OTHER): Payer: Medicare HMO | Admitting: Hematology and Oncology

## 2017-03-20 ENCOUNTER — Encounter: Payer: Self-pay | Admitting: Hematology and Oncology

## 2017-03-20 ENCOUNTER — Encounter: Payer: Self-pay | Admitting: Nurse Practitioner

## 2017-03-20 VITALS — BP 134/59 | HR 72 | Temp 98.4°F | Resp 18 | Ht 63.0 in | Wt 206.7 lb

## 2017-03-20 DIAGNOSIS — Z853 Personal history of malignant neoplasm of breast: Secondary | ICD-10-CM | POA: Diagnosis not present

## 2017-03-20 DIAGNOSIS — Z8509 Personal history of malignant neoplasm of other digestive organs: Secondary | ICD-10-CM | POA: Diagnosis not present

## 2017-03-20 DIAGNOSIS — Z8571 Personal history of Hodgkin lymphoma: Secondary | ICD-10-CM

## 2017-03-20 DIAGNOSIS — C8198 Hodgkin lymphoma, unspecified, lymph nodes of multiple sites: Secondary | ICD-10-CM

## 2017-03-20 LAB — CBC WITH DIFFERENTIAL/PLATELET
BASO%: 1.4 % (ref 0.0–2.0)
Basophils Absolute: 0.1 10*3/uL (ref 0.0–0.1)
EOS%: 2.3 % (ref 0.0–7.0)
Eosinophils Absolute: 0.2 10*3/uL (ref 0.0–0.5)
HCT: 38.8 % (ref 34.8–46.6)
HGB: 12.7 g/dL (ref 11.6–15.9)
LYMPH#: 2 10*3/uL (ref 0.9–3.3)
LYMPH%: 30.3 % (ref 14.0–49.7)
MCH: 30.9 pg (ref 25.1–34.0)
MCHC: 32.8 g/dL (ref 31.5–36.0)
MCV: 94.2 fL (ref 79.5–101.0)
MONO#: 0.4 10*3/uL (ref 0.1–0.9)
MONO%: 6.6 % (ref 0.0–14.0)
NEUT#: 4 10*3/uL (ref 1.5–6.5)
NEUT%: 59.4 % (ref 38.4–76.8)
Platelets: 318 10*3/uL (ref 145–400)
RBC: 4.12 10*6/uL (ref 3.70–5.45)
RDW: 13.5 % (ref 11.2–14.5)
WBC: 6.8 10*3/uL (ref 3.9–10.3)

## 2017-03-20 LAB — COMPREHENSIVE METABOLIC PANEL
ALT: 14 U/L (ref 0–55)
AST: 19 U/L (ref 5–34)
Albumin: 4.2 g/dL (ref 3.5–5.0)
Alkaline Phosphatase: 110 U/L (ref 40–150)
Anion Gap: 10 mEq/L (ref 3–11)
BUN: 15.5 mg/dL (ref 7.0–26.0)
CHLORIDE: 106 meq/L (ref 98–109)
CO2: 28 meq/L (ref 22–29)
CREATININE: 1 mg/dL (ref 0.6–1.1)
Calcium: 10 mg/dL (ref 8.4–10.4)
EGFR: 64 mL/min/{1.73_m2} — ABNORMAL LOW (ref >90)
Glucose: 98 mg/dl (ref 70–140)
Potassium: 3.8 mEq/L (ref 3.5–5.1)
SODIUM: 144 meq/L (ref 136–145)
Total Bilirubin: 0.88 mg/dL (ref 0.20–1.20)
Total Protein: 7.9 g/dL (ref 6.4–8.3)

## 2017-03-20 LAB — LACTATE DEHYDROGENASE: LDH: 209 U/L (ref 125–245)

## 2017-03-20 NOTE — Telephone Encounter (Signed)
Scheduled appt per 6/21 los - Gave patient AVS and calender per los.  

## 2017-03-20 NOTE — Assessment & Plan Note (Signed)
I review her most recent CT scan The stomach area did not show evidence of local recurrence of gist tumor I recommend GI referral for surveillance endoscopy

## 2017-03-20 NOTE — Progress Notes (Signed)
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 Staging scans showed adenopathy in the left internal jugular chain, mediastinum, peritoneum, moderate pericardial effusion along with cardiomegaly      03/01/2013 - 08/19/2013 Chemotherapy    The patient had 6 cycles of ABVD in [place]      01/31/2014 Imaging    Outside CT scan of the chest, abdomen and pelvis showed reduction in size of lymphadenopathy      03/03/2014 Imaging    Outside CT that showed no evidence of lymphadenopathy, 6 mm left thyroid nodule and postoperative partial resection of right lobe of thyroid      06/07/2014 PET scan    Outside PET scan showed no evidence of cancer recurrence      09/29/2014 Imaging    Outside CT scan of the neck, chest, abdomen and pelvis showed reduction in the size of mediastinal lymph nodes and stable sclerotic focus in L1 vertebral body      02/01/2015 PET scan    Outside PET scan show no evidence of disease      08/09/2015 PET scan    Outside PET scan showed no evidence of disease      11/16/2015 Imaging    Outside screening left breast mammogram showed asymmetric density in the upper inner left breast, spot compression view recommended ultrasound      12/04/2015 Imaging    Outside ultrasound of the left breast showed no masses.      01/31/2016 PET scan    Outside PET scan showed no evidence of cancer      10/04/2016 Imaging    CT scan showed no evidence for lymphadenopathy in the chest, abdomen, or pelvis. No findings to suggest soft tissue metastases in the chest, abdomen, or pelvis. 2. Postsurgical changes in the  stomach, possibly related to the history of GI stromal tumor. 3. Despite reported surgical history of appendectomy, the normal appendix is clearly visualized on today's exam. 4. Scattered, well-defined, small sclerotic lesions in the thoracic and lumbar spine. These are likely benign and related to bone islands, but without comparative imaging, sclerotic metastases are not entirely excluded. If warranted, nuclear medicine bone scan to confirm lack of active bony turnover at these sites may prove helpful. 5. Abdominal Aortic Atherosclerois (ICD10-170.0) 6. Status post hysterectomy.       INTERVAL HISTORY: Please see below for problem oriented charting. She returns for further follow-up She feels well Denies recent stomach issue, pain, reflux or nausea She denies any recent abnormal breast examination, palpable mass, abnormal breast appearance or nipple changes She denies new lymphadenopathy No recent infection  REVIEW OF SYSTEMS:   Constitutional: Denies fevers, chills or abnormal weight loss Eyes: Denies blurriness of vision Ears, nose, mouth, throat, and face: Denies mucositis or sore throat Respiratory: Denies cough, dyspnea or wheezes Cardiovascular: Denies palpitation, chest discomfort or lower extremity swelling Gastrointestinal:  Denies nausea, heartburn or change in bowel habits Skin: Denies abnormal skin rashes Lymphatics: Denies new lymphadenopathy or easy bruising Neurological:Denies numbness, tingling or new weaknesses Behavioral/Psych: Mood is stable, no new changes  All other systems were reviewed  with the patient and are negative.  I have reviewed the past medical history, past surgical history, social history and family history with the patient and they are unchanged from previous note.  ALLERGIES:  is allergic to latex.  MEDICATIONS:  Current Outpatient Prescriptions  Medication Sig Dispense Refill  . alendronate (FOSAMAX) 35 MG tablet Take 35 mg by mouth every 7  (seven) days. Take with a full glass of water on an empty stomach.    Marland Kitchen amLODipine (NORVASC) 10 MG tablet     . Calcium Carb-Cholecalciferol (CALCIUM 1000 + D) 1000-800 MG-UNIT TABS Take by mouth.    . cholecalciferol (VITAMIN D) 1000 units tablet Take 1,000 Units by mouth daily.    . diclofenac sodium (VOLTAREN) 1 % GEL Apply 2 g topically 4 (four) times daily as needed.    Marland Kitchen levothyroxine (SYNTHROID, LEVOTHROID) 100 MCG tablet     . Melatonin 3 MG CAPS Take by mouth.    . metoprolol (LOPRESSOR) 50 MG tablet     . Multiple Vitamin (MULTIVITAMIN) tablet Take 1 tablet by mouth daily.     No current facility-administered medications for this visit.     PHYSICAL EXAMINATION: ECOG PERFORMANCE STATUS: 0 - Asymptomatic  Vitals:   03/20/17 1245  BP: (!) 134/59  Pulse: 72  Resp: 18  Temp: 98.4 F (36.9 C)   Filed Weights   03/20/17 1245  Weight: 206 lb 11.2 oz (93.8 kg)    GENERAL:alert, no distress and comfortable SKIN: skin color, texture, turgor are normal, no rashes or significant lesions EYES: normal, Conjunctiva are pink and non-injected, sclera clear OROPHARYNX:no exudate, no erythema and lips, buccal mucosa, and tongue normal  NECK: supple, thyroid normal size, non-tender, without nodularity LYMPH:  no palpable lymphadenopathy in the cervical, axillary or inguinal LUNGS: clear to auscultation and percussion with normal breathing effort HEART: regular rate & rhythm and no murmurs and no lower extremity edema ABDOMEN:abdomen soft, non-tender and normal bowel sounds Musculoskeletal:no cyanosis of digits and no clubbing  NEURO: alert & oriented x 3 with fluent speech, no focal motor/sensory deficits Chest wall examination revealed right breast implant without other abnormalities. On the left breast, she had surgical scar compatible with breast resection surgery without other abnormalities  LABORATORY DATA:  I have reviewed the data as listed    Component Value Date/Time   NA  144 03/20/2017 1225   K 3.8 03/20/2017 1225   CO2 28 03/20/2017 1225   GLUCOSE 98 03/20/2017 1225   BUN 15.5 03/20/2017 1225   CREATININE 1.0 03/20/2017 1225   CALCIUM 10.0 03/20/2017 1225   PROT 7.9 03/20/2017 1225   ALBUMIN 4.2 03/20/2017 1225   AST 19 03/20/2017 1225   ALT 14 03/20/2017 1225   ALKPHOS 110 03/20/2017 1225   BILITOT 0.88 03/20/2017 1225    No results found for: SPEP, UPEP  Lab Results  Component Value Date   WBC 6.8 03/20/2017   NEUTROABS 4.0 03/20/2017   HGB 12.7 03/20/2017   HCT 38.8 03/20/2017   MCV 94.2 03/20/2017   PLT 318 03/20/2017      Chemistry      Component Value Date/Time   NA 144 03/20/2017 1225   K 3.8 03/20/2017 1225   CO2 28 03/20/2017 1225   BUN 15.5 03/20/2017 1225   CREATININE 1.0 03/20/2017 1225      Component Value Date/Time   CALCIUM 10.0 03/20/2017 1225   ALKPHOS 110 03/20/2017 1225   AST 19 03/20/2017 1225   ALT 14  03/20/2017 1225   BILITOT 0.88 03/20/2017 1225      ASSESSMENT & PLAN:  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 The patient is a long-term cancer survivor I will see her once a year with blood draw, history and physical examination  History of right breast cancer The patient have a remote history of right breast cancer status post mastectomy in 1990, adjuvant chemotherapy, and tamoxifen She was premenopausal at diagnosis. Subsequently, she has hysterectomy and bilateral salpingo-oophorectomy. She had BRCA genetic testing in 2016 with negative results. She will continue screening mammogram on the left breast Examination today is benign  History of gastrointestinal stromal tumor (GIST) I review her most recent CT scan The stomach area did not show evidence of local recurrence of gist tumor I recommend GI referral for surveillance endoscopy   Orders Placed This Encounter  Procedures  . CBC with Differential/Platelet    Standing  Status:   Future    Standing Expiration Date:   04/24/2018  . Comprehensive metabolic panel    Standing Status:   Future    Standing Expiration Date:   04/24/2018  . Lactate dehydrogenase (LDH)    Standing Status:   Future    Standing Expiration Date:   03/20/2018  . Ambulatory referral to Gastroenterology    Referral Priority:   Routine    Referral Type:   Consultation    Referral Reason:   Specialty Services Required    Number of Visits Requested:   1   All questions were answered. The patient knows to call the clinic with any problems, questions or concerns. No barriers to learning was detected. I spent 15 minutes counseling the patient face to face. The total time spent in the appointment was 20 minutes and more than 50% was on counseling and review of test results     Heath Lark, MD 03/20/2017 4:19 PM

## 2017-03-20 NOTE — Telephone Encounter (Signed)
Note from oncology is not completed yet. They had put this as a routine request, so must be routine follow up. First available is okay.  Thank you,  Almyra Free    ----- Message -----  From: Irven Baltimore  Sent: 03/20/2017  1:07 PM  To: Doristine Counter, RN    Almyra Free..    Dr. Loletha Carrow is Doc of the Day for this afternoon. Please advise on urgency of scheduling.    Thank You  Colletta Maryland

## 2017-03-20 NOTE — Assessment & Plan Note (Signed)
The patient have a remote history of right breast cancer status post mastectomy in 1990, adjuvant chemotherapy, and tamoxifen She was premenopausal at diagnosis. Subsequently, she has hysterectomy and bilateral salpingo-oophorectomy. She had BRCA genetic testing in 2016 with negative results. She will continue screening mammogram on the left breast Examination today is benign

## 2017-03-20 NOTE — Assessment & Plan Note (Signed)
Clinically, she has no signs of cancer recurrence. The patient is a long-term cancer survivor I will see her once a year with blood draw, history and physical examination

## 2017-03-24 DIAGNOSIS — C73 Malignant neoplasm of thyroid gland: Secondary | ICD-10-CM | POA: Diagnosis not present

## 2017-03-24 DIAGNOSIS — E039 Hypothyroidism, unspecified: Secondary | ICD-10-CM | POA: Diagnosis not present

## 2017-03-25 ENCOUNTER — Ambulatory Visit (HOSPITAL_COMMUNITY)
Admission: RE | Admit: 2017-03-25 | Discharge: 2017-03-25 | Disposition: A | Discharge status: Home / Self Care | Payer: Medicare HMO | Source: Ambulatory Visit | Attending: Internal Medicine | Admitting: Internal Medicine

## 2017-03-25 ENCOUNTER — Other Ambulatory Visit: Payer: Self-pay | Admitting: Internal Medicine

## 2017-03-25 DIAGNOSIS — Z85850 Personal history of malignant neoplasm of thyroid: Secondary | ICD-10-CM | POA: Diagnosis not present

## 2017-03-25 DIAGNOSIS — R0989 Other specified symptoms and signs involving the circulatory and respiratory systems: Secondary | ICD-10-CM

## 2017-04-01 ENCOUNTER — Other Ambulatory Visit: Payer: Self-pay | Admitting: Internal Medicine

## 2017-04-01 DIAGNOSIS — C73 Malignant neoplasm of thyroid gland: Secondary | ICD-10-CM

## 2017-04-07 ENCOUNTER — Encounter: Payer: Self-pay | Admitting: Nurse Practitioner

## 2017-04-07 ENCOUNTER — Ambulatory Visit (INDEPENDENT_AMBULATORY_CARE_PROVIDER_SITE_OTHER): Payer: Medicare HMO | Admitting: Nurse Practitioner

## 2017-04-07 VITALS — BP 132/74 | HR 68 | Ht 63.0 in | Wt 207.8 lb

## 2017-04-07 DIAGNOSIS — Z8509 Personal history of malignant neoplasm of other digestive organs: Secondary | ICD-10-CM

## 2017-04-07 DIAGNOSIS — Z1211 Encounter for screening for malignant neoplasm of colon: Secondary | ICD-10-CM | POA: Diagnosis not present

## 2017-04-07 NOTE — Progress Notes (Signed)
HPI: Patient is a 66 year old female referred by Oncologist Dr. Alvy Bimler for hx of GIST. Patient moved here from [facility].  She has a remote history of right breast cancer s/p mastectomy and adjuvant chemo. In 2014 she was diagnosed with Stage III Hodgkin's lymphoma, s/p chemo in [place]. I do not have records but sounds like a GIST was found incidentally sometimes between 2013-2014, maybe at time of sleeve resection. CT scan in January without evidence of recurrence. Patient has no abdominal pain, nausea, or weight loss.  Past Medical History:  Diagnosis Date  . Anemia   . Anxiety   . Blood transfusion without reported diagnosis   . Breast cancer ([place])    right breast  . Cancer ([place])    Hodgkin lymphoma  . History of chemotherapy   . Hypertension     Past Surgical History:  Procedure Laterality Date  . ABDOMINAL HYSTERECTOMY    . APPENDECTOMY    . AXILLARY LYMPH NODE DISSECTION Right    1990  . BREAST REDUCTION SURGERY Left   . CHOLECYSTECTOMY    . COLONOSCOPY    . ESOPHAGOGASTRODUODENOSCOPY    . IR GENERIC HISTORICAL  10/14/2016   IR REMOVAL TUN ACCESS W/ PORT W/O FL MOD SED 10/14/2016 Arne Cleveland, MD [facility]-INTERV RAD  . LAPAROSCOPIC GASTRIC SLEEVE RESECTION    . PORTACATH PLACEMENT     2014  . right mastectomy Right    1990  . THYROIDECTOMY, PARTIAL     Family History  Problem Relation Age of Onset  . Cancer Mother 17       colon ca  . Cancer Father 8       prostate ca   Social History  Substance Use Topics  . Smoking status: Never Smoker  . Smokeless tobacco: Never Used  . Alcohol use 3.0 oz/week    5 Glasses of wine per week     Comment: occas glass of wine   Current Outpatient Prescriptions  Medication Sig Dispense Refill  . alendronate (FOSAMAX) 35 MG tablet Take 35 mg by mouth every 7 (seven) days. Take with a full glass of water on an empty stomach.    Marland Kitchen amLODipine (NORVASC) 10 MG tablet     . Calcium Carb-Cholecalciferol (CALCIUM 1000  + D) 1000-800 MG-UNIT TABS Take by mouth.    . cholecalciferol (VITAMIN D) 1000 units tablet Take 1,000 Units by mouth daily.    . diclofenac sodium (VOLTAREN) 1 % GEL Apply 2 g topically 4 (four) times daily as needed.    Marland Kitchen levothyroxine (SYNTHROID, LEVOTHROID) 100 MCG tablet     . Melatonin 3 MG CAPS Take by mouth.    . metoprolol (LOPRESSOR) 50 MG tablet     . Multiple Vitamin (MULTIVITAMIN) tablet Take 1 tablet by mouth daily.    . Omega-3 1000 MG CAPS Take by mouth.     No current facility-administered medications for this visit.    Allergies  Allergen Reactions  . Latex      Review of Systems: Positive for allergy, sinus trouble, arthritis, vision changes and cough  All other systems reviewed and negative except where noted in HPI.    Physical Exam: BP 132/74   Pulse 68   Ht 5\' 3"  (1.6 m)   Wt 207 lb 12.8 oz (94.3 kg)   BMI 36.81 kg/m  Constitutional:  Well-developed, black female in no acute distress. Psychiatric: Normal mood and affect. Behavior is normal. EENT: Pupils normal.  Conjunctivae  are normal. No scleral icterus. Neck supple.  Cardiovascular: Normal rate, regular rhythm. No edema Pulmonary/chest: Effort normal. Velcro type sounds bilateral lower lobes much more pronounced in RLL Abdominal: Soft, nondistended. Nontender. Bowel sounds active throughout. There are no masses palpable. No hepatomegaly. Lymphadenopathy: No cervical adenopathy noted. Neurological: Alert and oriented to person place and time. Skin: Skin is warm and dry. No rashes noted.   ASSESSMENT AND PLAN:  1. Very pleasant 66 year old female with history of stage III Hodgkin's lymphoma, status post chemotherapy in [place]. No evidence for recurrence. Followed now by Dr. Alvy Bimler.   2. Hx of GIST, found incidentally. Patient denies resection but mentions that she had a sleeve procedure done. I wonder if the GIST was found at time of sleeve resection in 2013 ?? -Patient will be scheduled for  EGD to follow-up on her history of a GIST. The risks and benefits of EGD were discussed and the patient agrees to proceed.   3. Remote right breast cancer, s/p mastectomy and chemo  4. Colon cancer screening.  Normal colonoscopy in [place] in 2013 by [facility] per patient . Due for repeat in 2023. No blood in stool. Stools are a little hard, patient intends to start drinking more water. She takes MiraLAX if needed. -will request colonoscopy report and place on recall list   5. Thyroid nodules, followed by Endocrine   Tye Savoy, NP  04/07/2017, 3:20 PM  Cc:  Lucianne Lei, MD

## 2017-04-07 NOTE — Patient Instructions (Signed)
If you are age 67 or older, your body mass index should be between 23-30. Your Body mass index is 36.81 kg/m. If this is out of the aforementioned range listed, please consider follow up with your Primary Care Provider.  If you are age 26 or younger, your body mass index should be between 19-25. Your Body mass index is 36.81 kg/m. If this is out of the aformentioned range listed, please consider follow up with your Primary Care Provider.   You have been scheduled for an endoscopy. Please follow written instructions given to you at your visit today. If you use inhalers (even only as needed), please bring them with you on the day of your procedure. Your physician has requested that you go to www.startemmi.com and enter the access code given to you at your visit today. This web site gives a general overview about your procedure. However, you should still follow specific instructions given to you by our office regarding your preparation for the procedure.  Thank you for choosing me and [facility].   Tye Savoy, NP

## 2017-04-08 ENCOUNTER — Encounter: Payer: Self-pay | Admitting: Gastroenterology

## 2017-04-08 NOTE — Progress Notes (Signed)
Andrea Mendez, I think I'd like to see more documentation, records about her GIST before committing to EGD.  It will really help to know exactly what was seen (how big, ?FNA previously). Can you have records sent for and put a hold on the EGD till after that.  Thanks

## 2017-04-11 ENCOUNTER — Ambulatory Visit (HOSPITAL_COMMUNITY): Payer: Medicare HMO

## 2017-04-11 ENCOUNTER — Encounter: Payer: Self-pay | Admitting: Gastroenterology

## 2017-04-11 ENCOUNTER — Ambulatory Visit (AMBULATORY_SURGERY_CENTER): Payer: Medicare HMO | Admitting: Gastroenterology

## 2017-04-11 VITALS — BP 131/69 | HR 56 | Temp 99.4°F | Resp 14 | Ht 63.0 in | Wt 207.0 lb

## 2017-04-11 DIAGNOSIS — K2100 Gastro-esophageal reflux disease with esophagitis, without bleeding: Secondary | ICD-10-CM

## 2017-04-11 DIAGNOSIS — Z8509 Personal history of malignant neoplasm of other digestive organs: Secondary | ICD-10-CM

## 2017-04-11 DIAGNOSIS — K2970 Gastritis, unspecified, without bleeding: Secondary | ICD-10-CM | POA: Diagnosis not present

## 2017-04-11 DIAGNOSIS — I10 Essential (primary) hypertension: Secondary | ICD-10-CM | POA: Diagnosis not present

## 2017-04-11 DIAGNOSIS — Z85831 Personal history of malignant neoplasm of soft tissue: Secondary | ICD-10-CM | POA: Diagnosis not present

## 2017-04-11 MED ORDER — SODIUM CHLORIDE 0.9 % IV SOLN: 500.0000 mL | INTRAVENOUS | Status: AC

## 2017-04-11 MED ORDER — OMEPRAZOLE 40 MG PO CPDR: 40.0000 mg | DELAYED_RELEASE_CAPSULE | Freq: Every day | ORAL | 11 refills | Status: DC

## 2017-04-11 NOTE — Op Note (Signed)
[facility] Patient Name: Andrea Mendez Procedure Date: 04/11/2017 2:52 PM MRN: [id number] Endoscopist: Milus Banister , MD Age: 66 Referring MD:  Date of Birth: 31-Oct-1950 Gender: Female Account #: [id number] Procedure:                Upper GI endoscopy Indications:              Surveillance procedure; history of gastric GIST per                            referring MD Medicines:                Monitored Anesthesia Care Procedure:                Pre-Anesthesia Assessment:                           - Prior to the procedure, a History and Physical                            was performed, and patient medications and                            allergies were reviewed. The patient's tolerance of                            previous anesthesia was also reviewed. The risks                            and benefits of the procedure and the sedation                            options and risks were discussed with the patient.                            All questions were answered, and informed consent                            was obtained. Prior Anticoagulants: The patient has                            taken no previous anticoagulant or antiplatelet                            agents. ASA Grade Assessment: II - A patient with                            mild systemic disease. After reviewing the risks                            and benefits, the patient was deemed in                            satisfactory condition to undergo the procedure.  After obtaining informed consent, the endoscope was                            passed under direct vision. Throughout the                            procedure, the patient's blood pressure, pulse, and                            oxygen saturations were monitored continuously. The                            Model GIF-[id number]) scope was introduced                            through the mouth, and advanced to  the second part                            of duodenum. The upper GI endoscopy was                            accomplished without difficulty. The patient                            tolerated the procedure well. Scope In: Scope Out: Findings:                 Typical anatomy for sleeve gastrectomy was found.                           LA Grade B (one or more mucosal breaks greater than                            5 mm, not extending between the tops of two mucosal                            folds) esophagitis was found at the                            gastroesophageal junction.                           The exam was otherwise without abnormality. Complications:            No immediate complications. Estimated blood loss:                            None. Estimated Blood Loss:     Estimated blood loss: none. Impression:               - Typical anatomy for sleeve gastrectomy was found.                           - LA Grade B reflux esophagitis.                           -  The examination was otherwise normal. No                            endoscopic evidence of submucosal UGI tumors.                           - No specimens collected. Recommendation:           - Patient has a contact number available for                            emergencies. The signs and symptoms of potential                            delayed complications were discussed with the                            patient. Return to normal activities tomorrow.                            Written discharge instructions were provided to the                            patient.                           - Resume previous diet.                           - Continue present medications.                           - Please start once daily omeprazole (40mg  pill,                            one pill 20-30 min prior to BF meal daily, disp 30                            with 11 refills). Milus Banister, MD 04/11/2017 3:20:20 PM This report  has been signed electronically.

## 2017-04-11 NOTE — Progress Notes (Signed)
Pt states her ate 3 tablespoons of rice at 1000- J Monday CRNA made aware and ok to proceed

## 2017-04-11 NOTE — Progress Notes (Signed)
Report to PACU, RN, vss, BBS= Clear.  

## 2017-04-11 NOTE — Patient Instructions (Addendum)
Impressions/recommendations:  Reflux esophagitis (handouts given)  YOU HAD AN ENDOSCOPIC PROCEDURE TODAY AT [facility]:   Refer to the procedure report that was given to you for any specific questions about what was found during the examination.  If the procedure report does not answer your questions, please call your gastroenterologist to clarify.  If you requested that your care partner not be given the details of your procedure findings, then the procedure report has been included in a sealed envelope for you to review at your convenience later.  YOU SHOULD EXPECT: Some feelings of bloating in the abdomen. Passage of more gas than usual.  Walking can help get rid of the air that was put into your GI tract during the procedure and reduce the bloating. If you had a lower endoscopy (such as a colonoscopy or flexible sigmoidoscopy) you may notice spotting of blood in your stool or on the toilet paper. If you underwent a bowel prep for your procedure, you may not have a normal bowel movement for a few days.  Please Note:  You might notice some irritation and congestion in your nose or some drainage.  This is from the oxygen used during your procedure.  There is no need for concern and it should clear up in a day or so.  SYMPTOMS TO REPORT IMMEDIATELY:   Following upper endoscopy (EGD)  Vomiting of blood or coffee ground material  New chest pain or pain under the shoulder blades  Painful or persistently difficult swallowing  New shortness of breath  Fever of 100F or higher  Black, tarry-looking stools  For urgent or emergent issues, a gastroenterologist can be reached at any hour by calling [phone].   DIET:  We do recommend a small meal at first, but then you may proceed to your regular diet.  Drink plenty of fluids but you should avoid alcoholic beverages for 24 hours.  ACTIVITY:  You should plan to take it easy for the rest of today and you should NOT DRIVE or use  heavy machinery until tomorrow (because of the sedation medicines used during the test).    FOLLOW UP: Our staff will call the number listed on your records the next business day following your procedure to check on you and address any questions or concerns that you may have regarding the information given to you following your procedure. If we do not reach you, we will leave a message.  However, if you are feeling well and you are not experiencing any problems, there is no need to return our call.  We will assume that you have returned to your regular daily activities without incident.  If any biopsies were taken you will be contacted by phone or by letter within the next 1-3 weeks.  Please call us at [phone] if you have not heard about the biopsies in 3 weeks.    SIGNATURES/CONFIDENTIALITY: You and/or your care partner have signed paperwork which will be entered into your electronic medical record.  These signatures attest to the fact that that the information above on your After Visit Summary has been reviewed and is understood.  Full responsibility of the confidentiality of this discharge information lies with you and/or your care-partner.

## 2017-04-14 ENCOUNTER — Telehealth: Payer: Self-pay

## 2017-04-14 ENCOUNTER — Telehealth: Payer: Self-pay | Admitting: *Deleted

## 2017-04-14 ENCOUNTER — Ambulatory Visit (HOSPITAL_COMMUNITY)
Admission: RE | Admit: 2017-04-14 | Discharge: 2017-04-14 | Disposition: A | Discharge status: Home / Self Care | Payer: Medicare HMO | Source: Ambulatory Visit | Attending: Internal Medicine | Admitting: Internal Medicine

## 2017-04-14 DIAGNOSIS — C73 Malignant neoplasm of thyroid gland: Secondary | ICD-10-CM | POA: Diagnosis not present

## 2017-04-14 DIAGNOSIS — E042 Nontoxic multinodular goiter: Secondary | ICD-10-CM | POA: Diagnosis not present

## 2017-04-14 DIAGNOSIS — Z780 Asymptomatic menopausal state: Secondary | ICD-10-CM | POA: Diagnosis not present

## 2017-04-14 DIAGNOSIS — M8589 Other specified disorders of bone density and structure, multiple sites: Secondary | ICD-10-CM | POA: Diagnosis not present

## 2017-04-14 NOTE — Telephone Encounter (Signed)
  Follow up Call-  Call back number 04/11/2017  Post procedure Call Back phone  # [phone]  Permission to leave phone message Yes    No answer, left message.

## 2017-04-14 NOTE — Telephone Encounter (Signed)
Attempted to reach pt. With follow up call following endoscopic procedure 04/11/2017.  LM on pt. Ans. Machine to call us if they have any questions or concerns.

## 2017-04-16 DIAGNOSIS — E039 Hypothyroidism, unspecified: Secondary | ICD-10-CM | POA: Diagnosis not present

## 2017-04-16 DIAGNOSIS — I10 Essential (primary) hypertension: Secondary | ICD-10-CM | POA: Diagnosis not present

## 2017-04-16 DIAGNOSIS — M818 Other osteoporosis without current pathological fracture: Secondary | ICD-10-CM | POA: Diagnosis not present

## 2017-04-16 DIAGNOSIS — J9801 Acute bronchospasm: Secondary | ICD-10-CM | POA: Diagnosis not present

## 2017-05-05 ENCOUNTER — Ambulatory Visit: Payer: Medicare HMO | Admitting: Gastroenterology

## 2017-05-06 DIAGNOSIS — I10 Essential (primary) hypertension: Secondary | ICD-10-CM | POA: Diagnosis not present

## 2017-05-06 DIAGNOSIS — M130 Polyarthritis, unspecified: Secondary | ICD-10-CM | POA: Diagnosis not present

## 2017-05-06 DIAGNOSIS — E039 Hypothyroidism, unspecified: Secondary | ICD-10-CM | POA: Diagnosis not present

## 2017-07-09 DIAGNOSIS — E039 Hypothyroidism, unspecified: Secondary | ICD-10-CM | POA: Diagnosis not present

## 2017-07-09 DIAGNOSIS — C73 Malignant neoplasm of thyroid gland: Secondary | ICD-10-CM | POA: Diagnosis not present

## 2017-08-05 DIAGNOSIS — M130 Polyarthritis, unspecified: Secondary | ICD-10-CM | POA: Diagnosis not present

## 2017-08-05 DIAGNOSIS — E039 Hypothyroidism, unspecified: Secondary | ICD-10-CM | POA: Diagnosis not present

## 2017-08-05 DIAGNOSIS — I10 Essential (primary) hypertension: Secondary | ICD-10-CM | POA: Diagnosis not present

## 2017-10-20 DIAGNOSIS — M1310 Monoarthritis, not elsewhere classified, unspecified site: Secondary | ICD-10-CM | POA: Diagnosis not present

## 2017-10-20 DIAGNOSIS — J399 Disease of upper respiratory tract, unspecified: Secondary | ICD-10-CM | POA: Diagnosis not present

## 2017-10-20 DIAGNOSIS — I10 Essential (primary) hypertension: Secondary | ICD-10-CM | POA: Diagnosis not present

## 2017-11-14 DIAGNOSIS — H01021 Squamous blepharitis right upper eyelid: Secondary | ICD-10-CM | POA: Diagnosis not present

## 2017-11-14 DIAGNOSIS — H01022 Squamous blepharitis right lower eyelid: Secondary | ICD-10-CM | POA: Diagnosis not present

## 2017-11-14 DIAGNOSIS — H10413 Chronic giant papillary conjunctivitis, bilateral: Secondary | ICD-10-CM | POA: Diagnosis not present

## 2017-11-14 DIAGNOSIS — H01025 Squamous blepharitis left lower eyelid: Secondary | ICD-10-CM | POA: Diagnosis not present

## 2017-11-14 DIAGNOSIS — H2513 Age-related nuclear cataract, bilateral: Secondary | ICD-10-CM | POA: Diagnosis not present

## 2017-11-14 DIAGNOSIS — H01024 Squamous blepharitis left upper eyelid: Secondary | ICD-10-CM | POA: Diagnosis not present

## 2017-11-15 DIAGNOSIS — Z1231 Encounter for screening mammogram for malignant neoplasm of breast: Secondary | ICD-10-CM | POA: Diagnosis not present

## 2017-11-15 DIAGNOSIS — Z853 Personal history of malignant neoplasm of breast: Secondary | ICD-10-CM | POA: Diagnosis not present

## 2017-12-03 DIAGNOSIS — M170 Bilateral primary osteoarthritis of knee: Secondary | ICD-10-CM | POA: Diagnosis not present

## 2017-12-17 ENCOUNTER — Telehealth: Payer: Self-pay | Admitting: Hematology and Oncology

## 2017-12-17 NOTE — Telephone Encounter (Signed)
Tried to call regarding voicemail  °

## 2018-01-12 DIAGNOSIS — Z9889 Other specified postprocedural states: Secondary | ICD-10-CM

## 2018-01-12 DIAGNOSIS — Z8571 Personal history of Hodgkin lymphoma: Secondary | ICD-10-CM

## 2018-01-12 DIAGNOSIS — I10 Essential (primary) hypertension: Secondary | ICD-10-CM | POA: Diagnosis present

## 2018-01-12 DIAGNOSIS — M1711 Unilateral primary osteoarthritis, right knee: Secondary | ICD-10-CM | POA: Diagnosis present

## 2018-01-12 NOTE — H&P (Signed)
KNEE ARTHROPLASTY ADMISSION H&P  Patient ID: Andrea Mendez MRN: [id number] DOB/AGE: 68-May-1952 68 y.o.  Chief Complaint: right knee pain.  Planned Procedure Date: 02/03/2018 Medical and cardiac clearance by Dr. Criss Rosales Additional clearance by oncology: Dr. Alvy Bimler  HPI: Andrea Mendez is a 67 y.o. female with a history of thyroid disease, hypertension, lymphoma, breast cancer, and gastric sleeve who presents for evaluation of OA RIGHT KNEE. The patient has a history of pain and functional disability in the right knee due to arthritis and has failed non-surgical conservative treatments for greater than 12 weeks to include corticosteriod injections, viscosupplementation injections and activity modification.  Onset of symptoms was gradual, starting >10 years ago with gradually worsening course since that time.  Patient currently rates pain at 8 out of 10 with activity. Patient has night pain, worsening of pain with activity and weight bearing and pain that interferes with activities of daily living.  Patient has evidence of subchondral cysts, subchondral sclerosis, periarticular osteophytes and joint space narrowing by imaging studies.  There is no active infection.  Past Medical History:  Diagnosis Date  . Anemia   . Anxiety   . Blood transfusion without reported diagnosis   . Breast cancer ([place])    right breast  . Cancer ([place])    Hodgkin lymphoma  . History of chemotherapy   . Hypertension   . Sleep apnea    stopped using CPAP 2012   Past Surgical History:  Procedure Laterality Date  . ABDOMINAL HYSTERECTOMY    . AXILLARY LYMPH NODE DISSECTION Right    1990  . BREAST REDUCTION SURGERY Left   . CHOLECYSTECTOMY    . COLONOSCOPY    . ESOPHAGOGASTRODUODENOSCOPY    . HERNIA REPAIR    . IR GENERIC HISTORICAL  10/14/2016   IR REMOVAL TUN ACCESS W/ PORT W/O FL MOD SED 10/14/2016 Arne Cleveland, MD [facility] RAD  . LAPAROSCOPIC GASTRIC SLEEVE RESECTION    . PORTACATH PLACEMENT     2014  . right mastectomy Right    1990  . THYROIDECTOMY, PARTIAL    . UPPER GASTROINTESTINAL ENDOSCOPY     Allergies  Allergen Reactions  . Latex    Prior to Admission medications   Medication Sig Start Date End Date Taking? Authorizing Provider  alendronate (FOSAMAX) 35 MG tablet Take 35 mg by mouth every 7 (seven) days. Take with a full glass of water on an empty stomach.    [provider]  amLODipine (NORVASC) 10 MG tablet  07/03/16   [provider]  Calcium Carb-Cholecalciferol (CALCIUM 1000 + D) 1000-800 MG-UNIT TABS Take by mouth.    [provider]  cholecalciferol (VITAMIN D) 1000 units tablet Take 1,000 Units by mouth daily.    [provider]  diclofenac sodium (VOLTAREN) 1 % GEL Apply 2 g topically 4 (four) times daily as needed.    [provider]  levothyroxine (SYNTHROID, LEVOTHROID) 100 MCG tablet  07/03/16   [provider]  Melatonin 3 MG CAPS Take by mouth.    [provider]  metoprolol (LOPRESSOR) 50 MG tablet  07/03/16   [provider]  Multiple Vitamin (MULTIVITAMIN) tablet Take 1 tablet by mouth daily.    [provider]  Omega-3 1000 MG CAPS Take by mouth.    [provider]  omeprazole (PRILOSEC) 40 MG capsule Take 1 capsule (40 mg total) by mouth daily. Take one capsule 20-30 minutes before your first meal of the day. 04/11/17   Milus Banister, MD  Social History   Socioeconomic History  . Marital status: Married    Spouse name: Not on file  . Number of children: 3  . Years of education: Not on file  . Highest education level: Not on file  Occupational History  . Occupation: retired [employer]  Social Needs  . Financial resource strain: Not on file  . Food insecurity:    Worry: Not on file    Inability: Not on file  . Transportation needs:    Medical: Not on file    Non-medical: Not on file  Tobacco Use  . Smoking status: Never Smoker  . Smokeless  tobacco: Never Used  Substance and Sexual Activity  . Alcohol use: Yes    Alcohol/week: 3.0 oz    Types: 5 Glasses of wine per week    Comment: occas glass of wine  . Drug use: No  . Sexual activity: Not on file  Lifestyle  . Physical activity:    Days per week: Not on file    Minutes per session: Not on file  . Stress: Not on file  Relationships  . Social connections:    Talks on phone: Not on file    Gets together: Not on file    Attends religious service: Not on file    Active member of club or organization: Not on file    Attends meetings of clubs or organizations: Not on file    Relationship status: Not on file  Other Topics Concern  . Not on file  Social History Narrative  . Not on file   Family History  Problem Relation Age of Onset  . Cancer Mother 17       colon ca  . Colon cancer Mother   . Cancer Father 37       prostate ca  . Esophageal cancer Neg Hx   . Rectal cancer Neg Hx   . Stomach cancer Neg Hx     ROS: Currently denies lightheadedness, dizziness, Fever, chills, CP, SOB.   No personal history of DVT, PE, MI, or CVA Upper dentures. All other systems have been reviewed and were otherwise currently negative with the exception of those mentioned in the HPI and as above.  Objective: Vitals: Ht: 5 feet 1 inch wt: 205 temp: 98.4 BP: 120/78 pulse: 64 O2 89 % on room air.   Physical Exam: General: Alert, NAD.  Antalgic Gait.  Utilizes a cane. HEENT: EOMI, Good Neck Extension  Pulm: No increased work of breathing.  Clear B/L A/P w/o crackle or wheeze.  CV: RRR, No m/g/r appreciated  GI: soft, NT, ND Neuro: Neuro without gross focal deficit.  Sensation intact distally Skin: No lesions in the area of chief complaint MSK/Surgical Site: Right knee w/o redness or effusion.  + JLT. ROM 2-100.  5/5 strength in extension and flexion.  +EHL/FHL.  NVI.  Stable varus and valgus stress.    Imaging Review Plain radiographs demonstrate severe degenerative joint  disease of the right knee.   Preoperative templating of the joint replacement has been completed, documented, and submitted to the Operating Room personnel in order to optimize intra-operative equipment management.  Assessment: OA RIGHT KNEE Principal Problem:   Primary osteoarthritis of right knee Active Problems:   History of right breast cancer   History of gastrointestinal stromal tumor (GIST)   Hypertension   History of Hodgkin's lymphoma   History of gastric surgery   Plan: Plan for Procedure(s): RIGHT TOTAL KNEE ARTHROPLASTY  The patient  history, physical exam, clinical judgement of the provider and imaging are consistent with end stage degenerative joint disease and total joint arthroplasty is deemed medically necessary. The treatment options including medical management, injection therapy, and arthroplasty were discussed at length. The risks and benefits of Procedure(s): RIGHT TOTAL KNEE ARTHROPLASTY were presented and reviewed.  The risks of nonoperative treatment, versus surgical intervention including but not limited to continued pain, aseptic loosening, stiffness, dislocation/subluxation, infection, bleeding, nerve injury, blood clots, cardiopulmonary complications, morbidity, mortality, among others were discussed. The patient verbalizes understanding and wishes to proceed with the plan.  Patient is being admitted for inpatient treatment for surgery, pain control, PT, OT, prophylactic antibiotics, VTE prophylaxis, progressive ambulation, ADL's and discharge planning.   Dental prophylaxis discussed and recommended for 2 years postoperatively.   The patient does meet the criteria for TXA which will be used perioperatively.    Xarelto will be used postoperatively for DVT prophylaxis in addition to SCDs, and early ambulation dt h/o gastric sleeve.  No additional PO NSAID dt h/o Gastric Sleeve  The patient is planning to be discharged home with home health services  [facility] care) in care of her husband.   Patient's anticipated LOS is less than 2 midnights, meeting these requirements: - Lives within 1 hour of care - Has a competent adult at home to recover with post-op recover - NO history of  - Chronic pain requiring opiods  - Diabetes  - Coronary Artery Disease  - Heart failure  - Heart attack  - Stroke  - DVT/VTE  - Cardiac arrhythmia  - Respiratory Failure/COPD  - Renal failure  - Anemia  - Advanced Liver disease   Prudencio Burly III, PA-C 01/12/2018 8:14 AM

## 2018-01-19 DIAGNOSIS — C8199 Hodgkin lymphoma, unspecified, extranodal and solid organ sites: Secondary | ICD-10-CM | POA: Diagnosis not present

## 2018-01-19 DIAGNOSIS — I10 Essential (primary) hypertension: Secondary | ICD-10-CM | POA: Diagnosis not present

## 2018-01-19 DIAGNOSIS — J301 Allergic rhinitis due to pollen: Secondary | ICD-10-CM | POA: Diagnosis not present

## 2018-01-19 DIAGNOSIS — M130 Polyarthritis, unspecified: Secondary | ICD-10-CM | POA: Diagnosis not present

## 2018-01-19 DIAGNOSIS — E118 Type 2 diabetes mellitus with unspecified complications: Secondary | ICD-10-CM | POA: Diagnosis not present

## 2018-01-19 DIAGNOSIS — C50019 Malignant neoplasm of nipple and areola, unspecified female breast: Secondary | ICD-10-CM | POA: Diagnosis not present

## 2018-01-22 NOTE — Pre-Procedure Instructions (Signed)
Andrea Mendez  01/22/2018      [place], [address] [place] [place] [address] Phone: [phone] Fax: [phone]    Your procedure is scheduled on Tuesday May 7.  Report to [facility] Admitting at 5:30 A.M.  Call this number if you have problems the morning of surgery:  [phone]   Remember:  Do not eat food or drink liquids after midnight.  Take these medicines the morning of surgery with A SIP OF WATER:  Amlodipine (norvasc) Metoprolol (lopressor) Levothyroxine (synthroid) Omeprazole (prilosec)  7 days prior to surgery STOP taking any Aspirin(unless otherwise instructed by your surgeon), Aleve, Naproxen, Ibuprofen, Motrin, Advil, Goody's, BC's, all herbal medications, fish oil, and all vitamins    Do not wear jewelry, make-up or nail polish.  Do not wear lotions, powders, or perfumes, or deodorant.  Do not shave 48 hours prior to surgery.  Men may shave face and neck.  Do not bring valuables to the hospital.  [facility] is not responsible for any belongings or valuables.  Contacts, dentures or bridgework may not be worn into surgery.  Leave your suitcase in the car.  After surgery it may be brought to your room.  For patients admitted to the hospital, discharge time will be determined by your treatment team.  Patients discharged the day of surgery will not be allowed to drive home.    Special instructions:    - Preparing For Surgery  Before surgery, you can play an important role. Because skin is not sterile, your skin needs to be as free of germs as possible. You can reduce the number of germs on your skin by washing with CHG (chlorahexidine gluconate) Soap before surgery.  CHG is an antiseptic cleaner which kills germs and bonds with the skin to continue killing germs even after washing.  Please do not use if you have an allergy to CHG or antibacterial soaps. If your  skin becomes reddened/irritated stop using the CHG.  Do not shave (including legs and underarms) for at least 48 hours prior to first CHG shower. It is OK to shave your face.  Please follow these instructions carefully.   1. Shower the NIGHT BEFORE SURGERY and the MORNING OF SURGERY with CHG.   2. If you chose to wash your hair, wash your hair first as usual with your normal shampoo.  3. After you shampoo, rinse your hair and body thoroughly to remove the shampoo.  4. Use CHG as you would any other liquid soap. You can apply CHG directly to the skin and wash gently with a scrungie or a clean washcloth.   5. Apply the CHG Soap to your body ONLY FROM THE NECK DOWN.  Do not use on open wounds or open sores. Avoid contact with your eyes, ears, mouth and genitals (private parts). Wash Face and genitals (private parts)  with your normal soap.  6. Wash thoroughly, paying special attention to the area where your surgery will be performed.  7. Thoroughly rinse your body with warm water from the neck down.  8. DO NOT shower/wash with your normal soap after using and rinsing off the CHG Soap.  9. Pat yourself dry with a CLEAN TOWEL.  10. Wear CLEAN PAJAMAS to bed the night before surgery, wear comfortable clothes the morning of surgery  11. Place CLEAN SHEETS on your bed the night of your first shower and DO NOT SLEEP WITH  PETS.    Day of Surgery: Do not apply any deodorants/lotions. Please wear clean clothes to the hospital/surgery center.      Please read over the following fact sheets that you were given. Coughing and Deep Breathing, Total Joint Packet, MRSA Information and Surgical Site Infection Prevention

## 2018-01-23 ENCOUNTER — Other Ambulatory Visit: Payer: Self-pay

## 2018-01-23 ENCOUNTER — Encounter (HOSPITAL_COMMUNITY): Payer: Self-pay

## 2018-01-23 ENCOUNTER — Encounter (HOSPITAL_COMMUNITY)
Admission: RE | Admit: 2018-01-23 | Discharge: 2018-01-23 | Disposition: A | Discharge status: Home / Self Care | Payer: Medicare HMO | Source: Ambulatory Visit | Attending: Orthopedic Surgery | Admitting: Orthopedic Surgery

## 2018-01-23 DIAGNOSIS — Z01810 Encounter for preprocedural cardiovascular examination: Secondary | ICD-10-CM | POA: Insufficient documentation

## 2018-01-23 DIAGNOSIS — Z01812 Encounter for preprocedural laboratory examination: Secondary | ICD-10-CM | POA: Insufficient documentation

## 2018-01-23 DIAGNOSIS — I10 Essential (primary) hypertension: Secondary | ICD-10-CM | POA: Diagnosis not present

## 2018-01-23 HISTORY — DX: Gastro-esophageal reflux disease without esophagitis: K21.9

## 2018-01-23 HISTORY — DX: Nontoxic multinodular goiter: E04.2

## 2018-01-23 HISTORY — DX: Unspecified asthma, uncomplicated: J45.909

## 2018-01-23 HISTORY — DX: Hypothyroidism, unspecified: E03.9

## 2018-01-23 HISTORY — DX: Personal history of urinary calculi: Z87.442

## 2018-01-23 LAB — CBC
HEMATOCRIT: 38.1 % (ref 36.0–46.0)
Hemoglobin: 12.5 g/dL (ref 12.0–15.0)
MCH: 30.8 pg (ref 26.0–34.0)
MCHC: 32.8 g/dL (ref 30.0–36.0)
MCV: 93.8 fL (ref 78.0–100.0)
PLATELETS: 330 10*3/uL (ref 150–400)
RBC: 4.06 MIL/uL (ref 3.87–5.11)
RDW: 13.1 % (ref 11.5–15.5)
WBC: 6.9 10*3/uL (ref 4.0–10.5)

## 2018-01-23 LAB — BASIC METABOLIC PANEL
Anion gap: 12 (ref 5–15)
BUN: 10 mg/dL (ref 6–20)
CHLORIDE: 104 mmol/L (ref 101–111)
CO2: 25 mmol/L (ref 22–32)
CREATININE: 0.57 mg/dL (ref 0.44–1.00)
Calcium: 9.6 mg/dL (ref 8.9–10.3)
GFR calc non Af Amer: >60 mL/min (ref >60)
Glucose, Bld: 90 mg/dL (ref 65–99)
POTASSIUM: 4.8 mmol/L (ref 3.5–5.1)
SODIUM: 141 mmol/L (ref 135–145)

## 2018-01-23 LAB — SURGICAL PCR SCREEN
MRSA, PCR: NEGATIVE
Staphylococcus aureus: NEGATIVE

## 2018-01-23 NOTE — Progress Notes (Signed)
PCP - Lucianne Lei Cardiologist - denies  EKG - 01/23/2018  Stress Test - requested ECHO - ? Pt unsure  Anesthesia review: follow up cardiac records. Pt reports having stress test in [facility]. Ordered by Rod Mae, MD, fax request sent to both MD and Hospital.  Patient denies shortness of breath, fever, cough and chest pain at PAT appointment   Patient verbalized understanding of instructions that were given to them at the PAT appointment. Patient was also instructed that they will need to review over the PAT instructions again at home before surgery.

## 2018-01-26 NOTE — Progress Notes (Addendum)
Anesthesia Chart Review:   Case:  [id number] Date/Time:  02/03/18 0715   Procedure:  RIGHT TOTAL KNEE ARTHROPLASTY (Right Knee)   Anesthesia type:  Choice   Pre-op diagnosis:  OA RIGHT KNEE   Location:  [place] OR ROOM 09 / [place] OR   Surgeon:  Renette Butters, MD     Addendum:   Pt saw Jeri Lager, NP at [facility] on 01/29/18 for pre-op eval, abnormal EKG. Nuclear stress test performed 01/30/18 with low risk results (see below).  Pt cleared for surgery    DISCUSSION: Pt is a 67 year old female. No cardiac hx.  EKG abnormal.    Discussed with Dr. Nyoka Cowden who requested I send copy of EKG to PCP for review.  PCP sent pt to cardiology for pre-op eval   VS: BP 138/76   Pulse 80   Temp 36.6 C   Resp 20   Ht 5\' 2"  (1.575 m)   Wt 206 lb (93.4 kg)   SpO2 95%   BMI 37.68 kg/m    PROVIDERS: PCP is Lucianne Lei, MD   LABS: Labs reviewed: Acceptable for surgery. (all labs ordered are listed, but only abnormal results are displayed)  Labs Reviewed  SURGICAL PCR SCREEN  BASIC METABOLIC PANEL  CBC     IMAGES:  CXR 03/25/17: No active cardiopulmonary disease   EKG 01/23/18: NSR. T wave abnormality, consider inferolateral ischemia   CV:  Nuclear stress test 01/30/2018 [facility] cardiovascular): 1.  Lexiscan stress test performed.  Exercise capacity not assessed.  Stress symptoms included flushing, nausea.  Peak BP 170/86 mmHg.  Resting EKG demonstrated NSR, normal resting conduction, and no resting arrhythmias.  Inferolateral T wave inversions likely due to LV strain pattern.  Stress EKG nondiagnostic for ischemia this is a pharmacologic stress. 2.  Overall quality of study is good.  No evidence for abnormal lung activity.  Stress and rest SPECT images demonstrate homogeneous tracer distribution throughout the myocardium.  Gated SPECT imaging reveals normal myocardial thickening and wall motion.  LVEF normal 65%. 3.  Low risk study    Past Medical History:  Diagnosis Date   . Anemia    with Hodgkin's  . Anxiety   . Asthma    "they once said I had asthma"  . Blood transfusion without reported diagnosis   . Breast cancer ([place])    right breast  . Cancer ([place])    Hodgkin lymphoma  . GERD (gastroesophageal reflux disease)   . History of chemotherapy   . History of kidney stones   . Hypertension   . Hypothyroidism   . Multiple thyroid nodules    left and right  . Sleep apnea    stopped using CPAP 2012    Past Surgical History:  Procedure Laterality Date  . ABDOMINAL HYSTERECTOMY    . AXILLARY LYMPH NODE DISSECTION Right    1990  . BREAST REDUCTION SURGERY Left   . CHOLECYSTECTOMY    . COLONOSCOPY    . ESOPHAGOGASTRODUODENOSCOPY    . GANGLION CYST EXCISION     wrist  . HERNIA REPAIR    . IR GENERIC HISTORICAL  10/14/2016   IR REMOVAL TUN ACCESS W/ PORT W/O FL MOD SED 10/14/2016 Arne Cleveland, MD [facility]INTER[facility] RAD  . LAPAROSCOPIC GASTRIC SLEEVE RESECTION    . PORTACATH PLACEMENT     2014  . right mastectomy Right    1990  . THYROIDECTOMY, PARTIAL    . UPPER GASTROINTESTINAL ENDOSCOPY      MEDICATIONS: .  alendronate (FOSAMAX) 35 MG tablet  . amLODipine (NORVASC) 10 MG tablet  . Calcium Carbonate-Vitamin D (CALCIUM-VITAMIN D3 PO)  . cholecalciferol (VITAMIN D) 1000 units tablet  . levothyroxine (SYNTHROID, LEVOTHROID) 100 MCG tablet  . metoprolol (LOPRESSOR) 50 MG tablet  . montelukast (SINGULAIR) 10 MG tablet  . Multiple Vitamin (MULTIVITAMIN) tablet  . Omega-3 Fatty Acids (OMEGA-3 PO)  . omeprazole (PRILOSEC) 40 MG capsule  . Polyethyl Glycol-Propyl Glycol (SYSTANE OP)  . VITAMIN E PO   . 0.9 %  sodium chloride infusion    If no changes, I anticipate pt can proceed with surgery as scheduled.   Willeen Cass, FNP-BC [facility]/Anesthesiology Phone: [phone] 02/02/2018 12:30 PM

## 2018-01-30 DIAGNOSIS — Z01810 Encounter for preprocedural cardiovascular examination: Secondary | ICD-10-CM | POA: Diagnosis not present

## 2018-01-30 DIAGNOSIS — R9431 Abnormal electrocardiogram [ECG] [EKG]: Secondary | ICD-10-CM | POA: Diagnosis not present

## 2018-02-02 MED ORDER — CEFAZOLIN SODIUM-DEXTROSE 2-4 GM/100ML-% IV SOLN
2.0000 g | INTRAVENOUS | Status: AC
  Administered 2018-02-03: 2 g via INTRAVENOUS
  Filled 2018-02-02: qty 100

## 2018-02-02 MED ORDER — BUPIVACAINE LIPOSOME 1.3 % IJ SUSP
20.0000 mL | Freq: Once | INTRAMUSCULAR | Status: DC
  Filled 2018-02-02: qty 20

## 2018-02-02 MED ORDER — ACETAMINOPHEN 500 MG PO TABS
1000.0000 mg | ORAL_TABLET | Freq: Once | ORAL | Status: AC
  Administered 2018-02-03: 1000 mg via ORAL
  Filled 2018-02-02: qty 2

## 2018-02-02 MED ORDER — LACTATED RINGERS IV SOLN
INTRAVENOUS | Status: DC
  Administered 2018-02-03 (×2): via INTRAVENOUS

## 2018-02-02 MED ORDER — TRANEXAMIC ACID 1000 MG/10ML IV SOLN
1000.0000 mg | INTRAVENOUS | Status: AC
  Administered 2018-02-03: 1000 mg via INTRAVENOUS
  Filled 2018-02-02: qty 1100

## 2018-02-02 MED ORDER — GABAPENTIN 300 MG PO CAPS
300.0000 mg | ORAL_CAPSULE | Freq: Once | ORAL | Status: AC
  Administered 2018-02-03: 300 mg via ORAL
  Filled 2018-02-02: qty 1

## 2018-02-03 ENCOUNTER — Observation Stay (HOSPITAL_COMMUNITY): Payer: Medicare HMO

## 2018-02-03 ENCOUNTER — Observation Stay (HOSPITAL_COMMUNITY)
Admission: RE | Admit: 2018-02-03 | Discharge: 2018-02-04 | Disposition: A | Discharge status: Home / Self Care | Payer: Medicare HMO | Source: Ambulatory Visit | Attending: Orthopedic Surgery | Admitting: Orthopedic Surgery

## 2018-02-03 ENCOUNTER — Encounter (HOSPITAL_COMMUNITY)
Admission: RE | Disposition: A | Discharge status: Home / Self Care | Payer: Self-pay | Source: Ambulatory Visit | Attending: Orthopedic Surgery

## 2018-02-03 ENCOUNTER — Inpatient Hospital Stay (HOSPITAL_COMMUNITY): Payer: Medicare HMO | Admitting: Emergency Medicine

## 2018-02-03 ENCOUNTER — Encounter (HOSPITAL_COMMUNITY): Payer: Self-pay | Admitting: *Deleted

## 2018-02-03 DIAGNOSIS — I10 Essential (primary) hypertension: Secondary | ICD-10-CM | POA: Diagnosis not present

## 2018-02-03 DIAGNOSIS — Z853 Personal history of malignant neoplasm of breast: Secondary | ICD-10-CM | POA: Diagnosis not present

## 2018-02-03 DIAGNOSIS — Z8571 Personal history of Hodgkin lymphoma: Secondary | ICD-10-CM | POA: Diagnosis not present

## 2018-02-03 DIAGNOSIS — Z9221 Personal history of antineoplastic chemotherapy: Secondary | ICD-10-CM | POA: Diagnosis not present

## 2018-02-03 DIAGNOSIS — E039 Hypothyroidism, unspecified: Secondary | ICD-10-CM | POA: Insufficient documentation

## 2018-02-03 DIAGNOSIS — K219 Gastro-esophageal reflux disease without esophagitis: Secondary | ICD-10-CM | POA: Diagnosis not present

## 2018-02-03 DIAGNOSIS — Z96659 Presence of unspecified artificial knee joint: Secondary | ICD-10-CM

## 2018-02-03 DIAGNOSIS — G8918 Other acute postprocedural pain: Secondary | ICD-10-CM | POA: Diagnosis not present

## 2018-02-03 DIAGNOSIS — Z8509 Personal history of malignant neoplasm of other digestive organs: Secondary | ICD-10-CM | POA: Diagnosis present

## 2018-02-03 DIAGNOSIS — Z79890 Hormone replacement therapy (postmenopausal): Secondary | ICD-10-CM | POA: Insufficient documentation

## 2018-02-03 DIAGNOSIS — J45909 Unspecified asthma, uncomplicated: Secondary | ICD-10-CM | POA: Diagnosis not present

## 2018-02-03 DIAGNOSIS — E079 Disorder of thyroid, unspecified: Secondary | ICD-10-CM | POA: Diagnosis not present

## 2018-02-03 DIAGNOSIS — G4730 Sleep apnea, unspecified: Secondary | ICD-10-CM | POA: Diagnosis not present

## 2018-02-03 DIAGNOSIS — F419 Anxiety disorder, unspecified: Secondary | ICD-10-CM | POA: Diagnosis not present

## 2018-02-03 DIAGNOSIS — Z79899 Other long term (current) drug therapy: Secondary | ICD-10-CM | POA: Insufficient documentation

## 2018-02-03 DIAGNOSIS — Z471 Aftercare following joint replacement surgery: Secondary | ICD-10-CM | POA: Diagnosis not present

## 2018-02-03 DIAGNOSIS — M1711 Unilateral primary osteoarthritis, right knee: Secondary | ICD-10-CM | POA: Diagnosis not present

## 2018-02-03 DIAGNOSIS — M1710 Unilateral primary osteoarthritis, unspecified knee: Secondary | ICD-10-CM | POA: Diagnosis present

## 2018-02-03 DIAGNOSIS — Z96652 Presence of left artificial knee joint: Secondary | ICD-10-CM | POA: Diagnosis not present

## 2018-02-03 DIAGNOSIS — Z96651 Presence of right artificial knee joint: Secondary | ICD-10-CM | POA: Diagnosis not present

## 2018-02-03 DIAGNOSIS — Z9889 Other specified postprocedural states: Secondary | ICD-10-CM

## 2018-02-03 HISTORY — PX: TOTAL KNEE ARTHROPLASTY: SHX125

## 2018-02-03 SURGERY — ARTHROPLASTY, KNEE, TOTAL
Anesthesia: Spinal | Site: Knee | Laterality: Right

## 2018-02-03 MED ORDER — ONDANSETRON HCL 4 MG PO TABS: 4.0000 mg | ORAL_TABLET | Freq: Three times a day (TID) | ORAL | 0 refills | Status: DC | PRN

## 2018-02-03 MED ORDER — EPHEDRINE 5 MG/ML INJ
INTRAVENOUS | Status: AC
  Filled 2018-02-03: qty 10

## 2018-02-03 MED ORDER — PROMETHAZINE HCL 25 MG/ML IJ SOLN: 6.2500 mg | INTRAMUSCULAR | Status: DC | PRN

## 2018-02-03 MED ORDER — PHENYLEPHRINE 40 MCG/ML (10ML) SYRINGE FOR IV PUSH (FOR BLOOD PRESSURE SUPPORT)
PREFILLED_SYRINGE | INTRAVENOUS | Status: AC
  Filled 2018-02-03: qty 10

## 2018-02-03 MED ORDER — METOPROLOL TARTRATE 50 MG PO TABS
50.0000 mg | ORAL_TABLET | Freq: Two times a day (BID) | ORAL | Status: DC
  Administered 2018-02-03 – 2018-02-04 (×2): 50 mg via ORAL
  Filled 2018-02-03 (×2): qty 1

## 2018-02-03 MED ORDER — MEPERIDINE HCL 50 MG/ML IJ SOLN: 6.2500 mg | INTRAMUSCULAR | Status: DC | PRN

## 2018-02-03 MED ORDER — ONDANSETRON HCL 4 MG/2ML IJ SOLN
INTRAMUSCULAR | Status: DC | PRN
  Administered 2018-02-03: 4 mg via INTRAVENOUS

## 2018-02-03 MED ORDER — ACETAMINOPHEN 10 MG/ML IV SOLN: 1000.0000 mg | Freq: Once | INTRAVENOUS | Status: DC | PRN

## 2018-02-03 MED ORDER — ONDANSETRON HCL 4 MG/2ML IJ SOLN
4.0000 mg | Freq: Four times a day (QID) | INTRAMUSCULAR | Status: DC | PRN
  Administered 2018-02-03: 4 mg via INTRAVENOUS
  Filled 2018-02-03: qty 2

## 2018-02-03 MED ORDER — PROPOFOL 10 MG/ML IV BOLUS
INTRAVENOUS | Status: AC
  Filled 2018-02-03: qty 20

## 2018-02-03 MED ORDER — METHOCARBAMOL 1000 MG/10ML IJ SOLN
500.0000 mg | Freq: Four times a day (QID) | INTRAVENOUS | Status: DC | PRN
  Filled 2018-02-03: qty 5

## 2018-02-03 MED ORDER — LEVOTHYROXINE SODIUM 100 MCG PO TABS
100.0000 ug | ORAL_TABLET | Freq: Every day | ORAL | Status: DC
  Administered 2018-02-04: 100 ug via ORAL
  Filled 2018-02-03: qty 1

## 2018-02-03 MED ORDER — MIDAZOLAM HCL 2 MG/2ML IJ SOLN
INTRAMUSCULAR | Status: AC
  Filled 2018-02-03: qty 2

## 2018-02-03 MED ORDER — MENTHOL 3 MG MT LOZG: 1.0000 | LOZENGE | OROMUCOSAL | Status: DC | PRN

## 2018-02-03 MED ORDER — GLYCOPYRROLATE 0.2 MG/ML IJ SOLN
INTRAMUSCULAR | Status: DC | PRN
  Administered 2018-02-03: 0.2 mg via INTRAVENOUS

## 2018-02-03 MED ORDER — HYDROMORPHONE HCL 2 MG/ML IJ SOLN: 0.5000 mg | INTRAMUSCULAR | Status: DC | PRN

## 2018-02-03 MED ORDER — KETOROLAC TROMETHAMINE 15 MG/ML IJ SOLN
15.0000 mg | Freq: Four times a day (QID) | INTRAMUSCULAR | Status: AC
  Administered 2018-02-03 – 2018-02-04 (×4): 15 mg via INTRAVENOUS
  Filled 2018-02-03 (×4): qty 1

## 2018-02-03 MED ORDER — FENTANYL CITRATE (PF) 100 MCG/2ML IJ SOLN
INTRAMUSCULAR | Status: DC | PRN
  Administered 2018-02-03: 50 ug via INTRAVENOUS

## 2018-02-03 MED ORDER — OXYCODONE HCL 5 MG PO TABS
5.0000 mg | ORAL_TABLET | ORAL | Status: DC | PRN
  Administered 2018-02-03: 10 mg via ORAL
  Filled 2018-02-03: qty 2

## 2018-02-03 MED ORDER — PROPOFOL 500 MG/50ML IV EMUL
INTRAVENOUS | Status: DC | PRN
  Administered 2018-02-03: 50 ug/kg/min via INTRAVENOUS

## 2018-02-03 MED ORDER — PHENYLEPHRINE HCL 10 MG/ML IJ SOLN
INTRAVENOUS | Status: DC | PRN
  Administered 2018-02-03: 40 ug/min via INTRAVENOUS

## 2018-02-03 MED ORDER — SODIUM CHLORIDE 0.9 % IV SOLN
INTRAVENOUS | Status: DC | PRN
  Administered 2018-02-03: 09:00:00

## 2018-02-03 MED ORDER — AMLODIPINE BESYLATE 10 MG PO TABS
10.0000 mg | ORAL_TABLET | Freq: Every day | ORAL | Status: DC
  Administered 2018-02-04: 10 mg via ORAL
  Filled 2018-02-03: qty 1

## 2018-02-03 MED ORDER — MIDAZOLAM HCL 5 MG/5ML IJ SOLN
INTRAMUSCULAR | Status: DC | PRN
  Administered 2018-02-03: 1 mg via INTRAVENOUS

## 2018-02-03 MED ORDER — ACETAMINOPHEN 500 MG PO TABS: 1000.0000 mg | ORAL_TABLET | Freq: Three times a day (TID) | ORAL | 0 refills | Status: AC

## 2018-02-03 MED ORDER — LACTATED RINGERS IV SOLN
INTRAVENOUS | Status: DC
  Administered 2018-02-03: 11:00:00 via INTRAVENOUS

## 2018-02-03 MED ORDER — ACETAMINOPHEN 500 MG PO TABS
1000.0000 mg | ORAL_TABLET | Freq: Three times a day (TID) | ORAL | Status: DC
  Administered 2018-02-03 – 2018-02-04 (×3): 1000 mg via ORAL
  Filled 2018-02-03 (×3): qty 2

## 2018-02-03 MED ORDER — ROPIVACAINE HCL 5 MG/ML IJ SOLN
INTRAMUSCULAR | Status: DC | PRN
  Administered 2018-02-03: 30 mL via PERINEURAL

## 2018-02-03 MED ORDER — ONDANSETRON HCL 4 MG/2ML IJ SOLN
INTRAMUSCULAR | Status: AC
  Filled 2018-02-03: qty 2

## 2018-02-03 MED ORDER — DOCUSATE SODIUM 100 MG PO CAPS: 100.0000 mg | ORAL_CAPSULE | Freq: Two times a day (BID) | ORAL | 0 refills | Status: DC

## 2018-02-03 MED ORDER — METHOCARBAMOL 500 MG PO TABS: 500.0000 mg | ORAL_TABLET | Freq: Four times a day (QID) | ORAL | Status: DC | PRN

## 2018-02-03 MED ORDER — CHLORHEXIDINE GLUCONATE 4 % EX LIQD: 60.0000 mL | Freq: Once | CUTANEOUS | Status: DC

## 2018-02-03 MED ORDER — HYDROMORPHONE HCL 2 MG/ML IJ SOLN: 0.2500 mg | INTRAMUSCULAR | Status: DC | PRN

## 2018-02-03 MED ORDER — SODIUM CHLORIDE 0.9% FLUSH: INTRAVENOUS | Status: DC | PRN

## 2018-02-03 MED ORDER — MONTELUKAST SODIUM 10 MG PO TABS
10.0000 mg | ORAL_TABLET | Freq: Every day | ORAL | Status: DC
  Administered 2018-02-03: 10 mg via ORAL
  Filled 2018-02-03: qty 1

## 2018-02-03 MED ORDER — DEXAMETHASONE SODIUM PHOSPHATE 10 MG/ML IJ SOLN
10.0000 mg | Freq: Once | INTRAMUSCULAR | Status: AC
  Administered 2018-02-04: 10 mg via INTRAVENOUS
  Filled 2018-02-03: qty 1

## 2018-02-03 MED ORDER — FENTANYL CITRATE (PF) 250 MCG/5ML IJ SOLN
INTRAMUSCULAR | Status: AC
  Filled 2018-02-03: qty 5

## 2018-02-03 MED ORDER — DIPHENHYDRAMINE HCL 12.5 MG/5ML PO ELIX: 12.5000 mg | ORAL_SOLUTION | ORAL | Status: DC | PRN

## 2018-02-03 MED ORDER — PROPOFOL 10 MG/ML IV BOLUS
INTRAVENOUS | Status: DC | PRN
  Administered 2018-02-03: 20 mg via INTRAVENOUS

## 2018-02-03 MED ORDER — POVIDONE-IODINE 10 % EX SWAB: 2.0000 "application " | Freq: Once | CUTANEOUS | Status: DC

## 2018-02-03 MED ORDER — OXYCODONE HCL 5 MG PO TABS: 5.0000 mg | ORAL_TABLET | ORAL | 0 refills | Status: AC | PRN

## 2018-02-03 MED ORDER — 0.9 % SODIUM CHLORIDE (POUR BTL) OPTIME
TOPICAL | Status: DC | PRN
  Administered 2018-02-03: 1000 mL

## 2018-02-03 MED ORDER — PHENOL 1.4 % MT LIQD: 1.0000 | OROMUCOSAL | Status: DC | PRN

## 2018-02-03 MED ORDER — METHOCARBAMOL 500 MG PO TABS: 500.0000 mg | ORAL_TABLET | Freq: Four times a day (QID) | ORAL | 0 refills | Status: DC | PRN

## 2018-02-03 MED ORDER — ACETAMINOPHEN 325 MG PO TABS: 325.0000 mg | ORAL_TABLET | Freq: Four times a day (QID) | ORAL | Status: DC | PRN

## 2018-02-03 MED ORDER — PANTOPRAZOLE SODIUM 40 MG PO TBEC
40.0000 mg | DELAYED_RELEASE_TABLET | Freq: Every day | ORAL | Status: DC
  Administered 2018-02-04: 40 mg via ORAL
  Filled 2018-02-03: qty 1

## 2018-02-03 MED ORDER — BUPIVACAINE IN DEXTROSE 0.75-8.25 % IT SOLN
INTRATHECAL | Status: DC | PRN
  Administered 2018-02-03: 2 mL via INTRATHECAL

## 2018-02-03 MED ORDER — DEXAMETHASONE SODIUM PHOSPHATE 10 MG/ML IJ SOLN
INTRAMUSCULAR | Status: AC
  Filled 2018-02-03: qty 1

## 2018-02-03 MED ORDER — POLYETHYLENE GLYCOL 3350 17 G PO PACK: 17.0000 g | PACK | Freq: Every day | ORAL | Status: DC | PRN

## 2018-02-03 MED ORDER — METOCLOPRAMIDE HCL 5 MG PO TABS: 5.0000 mg | ORAL_TABLET | Freq: Three times a day (TID) | ORAL | Status: DC | PRN

## 2018-02-03 MED ORDER — DEXAMETHASONE SODIUM PHOSPHATE 10 MG/ML IJ SOLN
INTRAMUSCULAR | Status: DC | PRN
  Administered 2018-02-03: 5 mg via INTRAVENOUS

## 2018-02-03 MED ORDER — RIVAROXABAN 10 MG PO TABS: 10.0000 mg | ORAL_TABLET | Freq: Every day | ORAL | 0 refills | Status: DC

## 2018-02-03 MED ORDER — METOCLOPRAMIDE HCL 5 MG/ML IJ SOLN: 5.0000 mg | Freq: Three times a day (TID) | INTRAMUSCULAR | Status: DC | PRN

## 2018-02-03 MED ORDER — RIVAROXABAN 10 MG PO TABS
10.0000 mg | ORAL_TABLET | Freq: Every day | ORAL | Status: DC
  Administered 2018-02-04: 10 mg via ORAL
  Filled 2018-02-03: qty 1

## 2018-02-03 MED ORDER — SORBITOL 70 % SOLN: 30.0000 mL | Freq: Every day | Status: DC | PRN

## 2018-02-03 MED ORDER — ROCURONIUM BROMIDE 50 MG/5ML IV SOLN
INTRAVENOUS | Status: AC
  Filled 2018-02-03: qty 1

## 2018-02-03 MED ORDER — POLYETHYL GLYCOL-PROPYL GLYCOL 0.4-0.3 % OP GEL: Freq: Every day | OPHTHALMIC | Status: DC | PRN

## 2018-02-03 MED ORDER — POLYVINYL ALCOHOL 1.4 % OP SOLN
1.0000 [drp] | OPHTHALMIC | Status: DC | PRN
Start: 2018-02-03 — End: 2018-02-04
  Filled 2018-02-03: qty 15

## 2018-02-03 MED ORDER — DOCUSATE SODIUM 100 MG PO CAPS
100.0000 mg | ORAL_CAPSULE | Freq: Two times a day (BID) | ORAL | Status: DC
  Administered 2018-02-03 – 2018-02-04 (×2): 100 mg via ORAL
  Filled 2018-02-03 (×2): qty 1

## 2018-02-03 MED ORDER — CEFAZOLIN SODIUM-DEXTROSE 2-4 GM/100ML-% IV SOLN
2.0000 g | Freq: Four times a day (QID) | INTRAVENOUS | Status: AC
  Administered 2018-02-03 (×2): 2 g via INTRAVENOUS
  Filled 2018-02-03 (×2): qty 100

## 2018-02-03 MED ORDER — SODIUM CHLORIDE 0.9 % IR SOLN
Status: DC | PRN
  Administered 2018-02-03: 3000 mL

## 2018-02-03 MED ORDER — HYDROCODONE-ACETAMINOPHEN 7.5-325 MG PO TABS: 1.0000 | ORAL_TABLET | Freq: Once | ORAL | Status: DC | PRN

## 2018-02-03 MED ORDER — ONDANSETRON HCL 4 MG PO TABS: 4.0000 mg | ORAL_TABLET | Freq: Four times a day (QID) | ORAL | Status: DC | PRN

## 2018-02-03 MED ORDER — MAGNESIUM CITRATE PO SOLN: 1.0000 | Freq: Once | ORAL | Status: DC | PRN

## 2018-02-03 SURGICAL SUPPLY — 63 items
BANDAGE ELASTIC 6 VELCRO ST LF (GAUZE/BANDAGES/DRESSINGS) ×4 IMPLANT
BANDAGE ESMARK 6X9 LF (GAUZE/BANDAGES/DRESSINGS) ×1 IMPLANT
BEARIN INSERT TIBIAL SZ4 9 (Orthopedic Implant) ×2 IMPLANT
BEARING INSERT TIBIAL SZ4 9 (Orthopedic Implant) ×1 IMPLANT
BLADE SAG 18X100X1.27 (BLADE) ×4 IMPLANT
BNDG ELASTIC 6X10 VLCR STRL LF (GAUZE/BANDAGES/DRESSINGS) ×2 IMPLANT
BNDG ELASTIC 6X15 VLCR STRL LF (GAUZE/BANDAGES/DRESSINGS) ×2 IMPLANT
BNDG ESMARK 6X9 LF (GAUZE/BANDAGES/DRESSINGS) ×2
BOWL SMART MIX CTS (DISPOSABLE) ×2 IMPLANT
CEMENT BONE SIMPLEX SPEEDSET (Cement) IMPLANT
CLSR STERI-STRIP ANTIMIC 1/2X4 (GAUZE/BANDAGES/DRESSINGS) ×4 IMPLANT
COVER SURGICAL LIGHT HANDLE (MISCELLANEOUS) ×2 IMPLANT
CUFF TOURNIQUET SINGLE 34IN LL (TOURNIQUET CUFF) ×2 IMPLANT
DRAPE EXTREMITY T 121X128X90 (DRAPE) ×2 IMPLANT
DRAPE HALF SHEET 40X57 (DRAPES) ×2 IMPLANT
DRAPE IMP U-DRAPE 54X76 (DRAPES) ×2 IMPLANT
DRAPE U-SHAPE 47X51 STRL (DRAPES) ×2 IMPLANT
DRSG MEPILEX BORDER 4X12 (GAUZE/BANDAGES/DRESSINGS) ×2 IMPLANT
DRSG MEPILEX BORDER 4X8 (GAUZE/BANDAGES/DRESSINGS) ×2 IMPLANT
DURAPREP 26ML APPLICATOR (WOUND CARE) ×4 IMPLANT
ELECT CAUTERY BLADE 6.4 (BLADE) ×2 IMPLANT
ELECT REM PT RETURN 9FT ADLT (ELECTROSURGICAL) ×2
ELECTRODE REM PT RTRN 9FT ADLT (ELECTROSURGICAL) ×1 IMPLANT
FACESHIELD WRAPAROUND (MASK) ×4 IMPLANT
FEMORAL POSTERIOR SZ4 RT (Femur) ×1 IMPLANT
GLOVE BIO SURGEON STRL SZ7.5 (GLOVE) ×4 IMPLANT
GLOVE BIOGEL PI IND STRL 7.0 (GLOVE) ×1 IMPLANT
GLOVE BIOGEL PI IND STRL 8 (GLOVE) ×2 IMPLANT
GLOVE BIOGEL PI INDICATOR 7.0 (GLOVE) ×1
GLOVE BIOGEL PI INDICATOR 8 (GLOVE) ×2
GLOVE INDICATOR 6.5 STRL GRN (GLOVE) ×4 IMPLANT
GOWN STRL REUS W/ TWL LRG LVL3 (GOWN DISPOSABLE) ×2 IMPLANT
GOWN STRL REUS W/ TWL XL LVL3 (GOWN DISPOSABLE) ×1 IMPLANT
GOWN STRL REUS W/TWL LRG LVL3 (GOWN DISPOSABLE) ×2
GOWN STRL REUS W/TWL XL LVL3 (GOWN DISPOSABLE) ×1
HANDPIECE INTERPULSE COAX TIP (DISPOSABLE) ×1
IMMOBILIZER KNEE 22 UNIV (SOFTGOODS) ×2 IMPLANT
KIT BASIN OR (CUSTOM PROCEDURE TRAY) ×2 IMPLANT
KIT TURNOVER KIT B (KITS) ×2 IMPLANT
KNEE PATELLA ASYMMETRIC 9X29 (Knees) ×2 IMPLANT
KNEE TIBIAL COMP TRI SZ4 (Knees) ×2 IMPLANT
MANIFOLD NEPTUNE II (INSTRUMENTS) ×2 IMPLANT
NEEDLE 22X1 1/2 (OR ONLY) (NEEDLE) ×2 IMPLANT
NS IRRIG 1000ML POUR BTL (IV SOLUTION) ×2 IMPLANT
PACK TOTAL JOINT (CUSTOM PROCEDURE TRAY) ×2 IMPLANT
PAD ARMBOARD 7.5X6 YLW CONV (MISCELLANEOUS) ×2 IMPLANT
POSTERIOR FEMORAL SZ4 RT (Femur) ×2 IMPLANT
SET HNDPC FAN SPRY TIP SCT (DISPOSABLE) ×1 IMPLANT
SUCTION FRAZIER HANDLE 10FR (MISCELLANEOUS) ×1
SUCTION TUBE FRAZIER 10FR DISP (MISCELLANEOUS) ×1 IMPLANT
SUT MNCRL AB 4-0 PS2 18 (SUTURE) ×2 IMPLANT
SUT MON AB 2-0 CT1 27 (SUTURE) ×2 IMPLANT
SUT VIC AB 0 CT1 27 (SUTURE) ×2
SUT VIC AB 0 CT1 27XBRD ANBCTR (SUTURE) ×2 IMPLANT
SUT VIC AB 1 CT1 27 (SUTURE) ×2
SUT VIC AB 1 CT1 27XBRD ANBCTR (SUTURE) ×2 IMPLANT
SUT VIC AB 2-0 CT1 27 (SUTURE) ×2
SUT VIC AB 2-0 CT1 TAPERPNT 27 (SUTURE) ×2 IMPLANT
SYR 50ML LL SCALE MARK (SYRINGE) ×2 IMPLANT
TOWEL OR 17X24 6PK STRL BLUE (TOWEL DISPOSABLE) ×2 IMPLANT
TOWEL OR 17X26 10 PK STRL BLUE (TOWEL DISPOSABLE) ×2 IMPLANT
TRAY CATH 16FR W/PLASTIC CATH (SET/KITS/TRAYS/PACK) IMPLANT
TRAY FOLEY BAG SILVER LF 14FR (CATHETERS) IMPLANT

## 2018-02-03 NOTE — Anesthesia Preprocedure Evaluation (Addendum)
Anesthesia Evaluation  Patient identified by MRN, date of birth, ID band Patient awake    Reviewed: Allergy & Precautions, NPO status , Patient's Chart, lab work & pertinent test results  Airway Mallampati: II  TM Distance: >3 FB Neck ROM: Full    Dental  (+) Partial Upper   Pulmonary sleep apnea ,    Pulmonary exam normal breath sounds clear to auscultation       Cardiovascular Exercise Tolerance: Good hypertension, Pt. on medications Normal cardiovascular exam Rhythm:Regular Rate:Normal  01/30/18 Gated SPECT imaging reveals normal myocardial thickening and wall motion.  LVEF normal 65%.   Neuro/Psych Anxiety negative neurological ROS     GI/Hepatic Neg liver ROS, GERD  Medicated,  Endo/Other  Hypothyroidism   Renal/GU negative Renal ROS  negative genitourinary   Musculoskeletal  (+) Arthritis , Osteoarthritis,    Abdominal (+) + obese,   Peds  Hematology   Anesthesia Other Findings Hx of breast ca s/p chemo  Reproductive/Obstetrics negative OB ROS                           Lab Results  Component Value Date   CREATININE 0.57 01/23/2018   BUN 10 01/23/2018   NA 141 01/23/2018   K 4.8 01/23/2018   CL 104 01/23/2018   CO2 25 01/23/2018    Lab Results  Component Value Date   WBC 6.9 01/23/2018   HGB 12.5 01/23/2018   HCT 38.1 01/23/2018   MCV 93.8 01/23/2018   PLT 330 01/23/2018    Anesthesia Physical Anesthesia Plan  ASA: II  Anesthesia Plan: Spinal   Post-op Pain Management:  Regional for Post-op pain   Induction:   PONV Risk Score and Plan: Treatment may vary due to age or medical condition  Airway Management Planned: Mask, Natural Airway and Nasal Cannula  Additional Equipment:   Intra-op Plan:   Post-operative Plan:   Informed Consent: I have reviewed the patients History and Physical, chart, labs and discussed the procedure including the risks, benefits and  alternatives for the proposed anesthesia with the patient or authorized representative who has indicated his/her understanding and acceptance.     Plan Discussed with: CRNA  Anesthesia Plan Comments:         Anesthesia Quick Evaluation

## 2018-02-03 NOTE — Progress Notes (Signed)
Orthopedic Tech Progress Note Patient Details:  Andrea Mendez [birth year] [id number]  CPM Right Knee CPM Right Knee: On Right Knee Flexion (Degrees): 90 Right Knee Extension (Degrees): 0  Post Interventions Patient Tolerated: Well Instructions Provided: Care of device  Hildred Priest 02/03/2018, 10:41 AM

## 2018-02-03 NOTE — Op Note (Signed)
DATE OF SURGERY:  02/03/2018 TIME: 8:56 AM  PATIENT NAME:  Andrea Mendez   AGE: 67 y.o.    PRE-OPERATIVE DIAGNOSIS:  OA RIGHT KNEE  POST-OPERATIVE DIAGNOSIS:  Same  PROCEDURE:  Procedure(s): RIGHT TOTAL KNEE ARTHROPLASTY   SURGEON:  Crystal Scarberry D, MD   ASSISTANT:  Roxan Hockey, PA-C, he was present and scrubbed throughout the case, critical for completion in a timely fashion, and for retraction, instrumentation, and closure.    OPERATIVE IMPLANTS: [employer]ker Triathlon Posterior Stabilized. Press fit knee  Femur size 4, Tibia size 4, Patella size 29 3-peg oval button, with a 9 mm polyethylene insert.   PREOPERATIVE INDICATIONS:  Andrea Mendez is a 67 y.o. year old female with end stage bone on bone degenerative arthritis of the knee who failed conservative treatment, including injections, antiinflammatories, activity modification, and assistive devices, and had significant impairment of their activities of daily living, and elected for Total Knee Arthroplasty.   The risks, benefits, and alternatives were discussed at length including but not limited to the risks of infection, bleeding, nerve injury, stiffness, blood clots, the need for revision surgery, cardiopulmonary complications, among others, and they were willing to proceed.   OPERATIVE DESCRIPTION:  The patient was brought to the operative room and placed in a supine position.  General anesthesia was administered.  IV antibiotics were given.  The lower extremity was prepped and draped in the usual sterile fashion.  Time out was performed.  The leg was elevated and exsanguinated and the tourniquet was inflated.  Anterior approach was performed.  The patella was everted and osteophytes were removed.  The anterior horn of the medial and lateral meniscus was removed.   The distal femur was opened with the drill and the intramedullary distal femoral cutting jig was utilized, set at 5 degrees resecting 10 mm off the  distal femur.  Care was taken to protect the collateral ligaments.  The distal femoral sizing jig was applied, taking care to avoid notching.  Then the 4-in-1 cutting jig was applied and the anterior and posterior femur was cut, along with the chamfer cuts.  All posterior osteophytes were removed.  The flexion gap was then measured and was symmetric with the extension gap.  Then the extramedullary tibial cutting jig was utilized making the appropriate cut using the anterior tibial crest as a reference building in appropriate posterior slope.  Care was taken during the cut to protect the medial and collateral ligaments.  The proximal tibia was removed along with the posterior horns of the menisci.  The PCL was sacrificed.    The extensor gap was measured and was approximately 68mm.    I completed the distal femoral preparation using the appropriate jig to prepare the box.  The patella was then measured, and cut with the saw.    The proximal tibia sized and prepared accordingly with the reamer and the punch, and then all components were trialed with the above sized poly insert.  The knee was found to have excellent balance and full motion.    The above named components were then impacted into place and Poly tibial piece and patella were inserted.  I was very happy with his stability and ROM  I performed a periarticular injection with marcaine and toradol  The knee was easily taken through a range of motion and the patella tracked well and the knee irrigated copiously and the parapatellar and subcutaneous tissue closed with vicryl, and monocryl with steri strips for the skin.  The  incision was dressed with sterile gauze and the tourniquet released and the patient was awakened and returned to the PACU in stable and satisfactory condition.  There were no complications.  Total tourniquet time was roughly 75 minutes.   POSTOPERATIVE PLAN: post op Abx, DVT px: SCD's, TED's, Early ambulation and  chemical px

## 2018-02-03 NOTE — Anesthesia Procedure Notes (Signed)
Date/Time: 02/03/2018 7:45 AM Performed by: Izora Gala, CRNA Pre-anesthesia Checklist: Patient identified, Emergency Drugs available, Suction available and Patient being monitored Patient Re-evaluated:Patient Re-evaluated prior to induction Oxygen Delivery Method: Simple face mask Preoxygenation: Pre-oxygenation with 100% oxygen Induction Type: IV induction Placement Confirmation: positive ETCO2

## 2018-02-03 NOTE — Evaluation (Signed)
Physical Therapy Evaluation Patient Details Name: Andrea Mendez MRN: [id number] DOB: [birth year] Today's Date: 02/03/2018   History of Present Illness  Pt is a 67 y/o female s/p elective R TKA. PMH includes R breast cancer s/p mastectomy, hodgkins lymphoma, and HTN.   Clinical Impression  Pt is s/p surgery above with deficits below. Pt limited by lethargy this session, and only performed bed mobility secondary to safety concerns with mobility progression. Required min A for basic bed mobility tasks. Reviewed knee precautions and supine HEP with pt, however, will need further review. Will continue to follow acutely to maximize functional mobility independence and safety.     Follow Up Recommendations Follow surgeon's recommendation for DC plan and follow-up therapies;Supervision for mobility/OOB    Equipment Recommendations  3in1 (PT)    Recommendations for Other Services       Precautions / Restrictions Precautions Precautions: Knee Precaution Booklet Issued: Yes (comment) Precaution Comments: Reviewed knee precautions and supine HEP.  Restrictions Weight Bearing Restrictions: Yes RLE Weight Bearing: Weight bearing as tolerated      Mobility  Bed Mobility Overal bed mobility: Needs Assistance Bed Mobility: Supine to Sit;Sit to Supine;Rolling Rolling: Supervision   Supine to sit: Min assist Sit to supine: Min assist   General bed mobility comments: Supervision to roll side to side to change wet bed linens. Min A for RLE assist to sit at EOB and to return to supine. Pt continued to be sleepy in sitting, so further mobility deferred secondary to safety concerns.   Transfers                 General transfer comment: Deferred   Ambulation/Gait                Stairs            Wheelchair Mobility    Modified Rankin (Stroke Patients Only)       Balance Overall balance assessment: Needs assistance Sitting-balance support: No upper extremity  supported;Feet supported Sitting balance-Leahy Scale: Fair                                       Pertinent Vitals/Pain Pain Assessment: 0-10 Pain Score: 4  Pain Location: R knee  Pain Descriptors / Indicators: Aching;Operative site guarding Pain Intervention(s): Limited activity within patient's tolerance;Monitored during session;Repositioned    Home Living Family/patient expects to be discharged to:: Private residence Living Arrangements: Spouse/significant other Available Help at Discharge: Family;Available 24 hours/day Type of Home: Apartment Home Access: Level entry     Home Layout: One level Home Equipment: Walker - 2 wheels;Grab bars - tub/shower      Prior Function Level of Independence: Independent with assistive device(s)         Comments: Used cane for ambulation      Hand Dominance        Extremity/Trunk Assessment   Upper Extremity Assessment Upper Extremity Assessment: Defer to OT evaluation    Lower Extremity Assessment Lower Extremity Assessment: RLE deficits/detail RLE Deficits / Details: Reports some decreased sensation. Able to perform ther ex below. Deficits consistent with post op pain and weakness.     Cervical / Trunk Assessment Cervical / Trunk Assessment: Normal  Communication   Communication: No difficulties  Cognition Arousal/Alertness: Suspect due to medications Behavior During Therapy: WFL for tasks assessed/performed Overall Cognitive Status: Within Functional Limits for tasks assessed  General Comments: Pt very sleepy during session which limited mobility.       General Comments      Exercises Total Joint Exercises Ankle Circles/Pumps: AROM;10 reps;Right Quad Sets: AROM;Right;10 reps   Assessment/Plan    PT Assessment Patient needs continued PT services  PT Problem List Decreased strength;Decreased range of motion;Decreased activity tolerance;Decreased  balance;Decreased mobility;Decreased knowledge of use of DME;Decreased knowledge of precautions;Pain;Impaired sensation       PT Treatment Interventions DME instruction;Gait training;Functional mobility training;Therapeutic activities;Therapeutic exercise;Balance training;Patient/family education    PT Goals (Current goals can be found in the Care Plan section)  Acute Rehab PT Goals Patient Stated Goal: to go home  PT Goal Formulation: With patient Time For Goal Achievement: 02/17/18 Potential to Achieve Goals: Good    Frequency 7X/week   Barriers to discharge        Co-evaluation               AM-PAC PT "6 Clicks" Daily Activity  Outcome Measure Difficulty turning over in bed (including adjusting bedclothes, sheets and blankets)?: A Little Difficulty moving from lying on back to sitting on the side of the bed? : Unable Difficulty sitting down on and standing up from a chair with arms (e.g., wheelchair, bedside commode, etc,.)?: Unable Help needed moving to and from a bed to chair (including a wheelchair)?: A Lot Help needed walking in hospital room?: A Lot Help needed climbing 3-5 steps with a railing? : Total 6 Click Score: 10    End of Session   Activity Tolerance: Patient limited by lethargy Patient left: in bed;with call bell/phone within reach Nurse Communication: Mobility status PT Visit Diagnosis: Other abnormalities of gait and mobility (R26.89);Pain Pain - Right/Left: Right Pain - part of body: Knee    Time: 9826-4158 PT Time Calculation (min) (ACUTE ONLY): 18 min   Charges:   PT Evaluation $PT Eval Moderate Complexity: 1 Mod     PT G Codes:        Leighton Ruff, PT, DPT  Acute Rehabilitation Services  Pager: [phone]   Rudean Hitt 02/03/2018, 4:09 PM

## 2018-02-03 NOTE — Anesthesia Postprocedure Evaluation (Signed)
Anesthesia Post Note  Patient: Felix Pratt  Procedure(s) Performed: RIGHT TOTAL KNEE ARTHROPLASTY (Right Knee)     Patient location during evaluation: PACU Anesthesia Type: Spinal Level of consciousness: oriented and awake and alert Pain management: pain level controlled Vital Signs Assessment: post-procedure vital signs reviewed and stable Respiratory status: spontaneous breathing, respiratory function stable and patient connected to nasal cannula oxygen Cardiovascular status: blood pressure returned to baseline and stable Postop Assessment: no headache, no backache and no apparent nausea or vomiting Anesthetic complications: no    Last Vitals:  Vitals:   02/03/18 1038 02/03/18 1040  BP: 109/65   Pulse:    Resp:    Temp:  36.5 C  SpO2:      Last Pain:  Vitals:   02/03/18 1040  TempSrc:   PainSc: 0-No pain                 Barnet Glasgow

## 2018-02-03 NOTE — Interval H&P Note (Signed)
History and Physical Interval Note:  02/03/2018 7:14 AM  Andrea Mendez  has presented today for surgery, with the diagnosis of OA RIGHT KNEE  The various methods of treatment have been discussed with the patient and family. After consideration of risks, benefits and other options for treatment, the patient has consented to  Procedure(s): RIGHT TOTAL KNEE ARTHROPLASTY (Right) as a surgical intervention .  The patient's history has been reviewed, patient examined, no change in status, stable for surgery.  I have reviewed the patient's chart and labs.  Questions were answered to the patient's satisfaction.     MURPHY, TIMOTHY D

## 2018-02-03 NOTE — Transfer of Care (Signed)
Immediate Anesthesia Transfer of Care Note  Patient: Andrea Mendez  Procedure(s) Performed: RIGHT TOTAL KNEE ARTHROPLASTY (Right Knee)  Patient Location: PACU  Anesthesia Type:Regional and Spinal  Level of Consciousness: awake, alert , oriented and patient cooperative  Airway & Oxygen Therapy: Patient Spontanous Breathing and Patient connected to nasal cannula oxygen  Post-op Assessment: Report given to RN, Post -op Vital signs reviewed and stable and Patient moving all extremities  Post vital signs: Reviewed and stable  Last Vitals:  Vitals Value Taken Time  BP 109/63 02/03/2018  9:38 AM  Temp    Pulse 55 02/03/2018  9:39 AM  Resp 14 02/03/2018  9:39 AM  SpO2 99 % 02/03/2018  9:39 AM  Vitals shown include unvalidated device data.  Last Pain:  Vitals:   02/03/18 0610  TempSrc: Oral  PainSc:          Complications: No apparent anesthesia complications

## 2018-02-03 NOTE — Anesthesia Procedure Notes (Signed)
Spinal  Patient location during procedure: OR Start time: 02/03/2018 7:28 AM End time: 02/03/2018 7:35 AM Staffing Anesthesiologist: Barnet Glasgow, MD Performed: anesthesiologist  Preanesthetic Checklist Completed: patient identified, surgical consent, pre-op evaluation, timeout performed, IV checked, risks and benefits discussed and monitors and equipment checked Spinal Block Patient position: sitting Prep: site prepped and draped and DuraPrep Patient monitoring: heart rate, cardiac monitor, continuous pulse ox and blood pressure Approach: midline Location: L2-3 Injection technique: single-shot Needle Needle type: Pencan  Needle gauge: 24 G Needle length: 10 cm Assessment Sensory level: T4

## 2018-02-03 NOTE — Progress Notes (Signed)
PT Cancellation Note  Patient Details Name: Andrea Mendez MRN: [id number] DOB: 1951/03/24   Cancelled Treatment:    Reason Eval/Treat Not Completed: Other (comment) Pt requesting to eat lunch at this time. Will check back as schedule allows.  Leighton Ruff, PT, DPT  Acute Rehabilitation Services  Pager: [phone]    Andrea Mendez 02/03/2018, 1:13 PM

## 2018-02-03 NOTE — Anesthesia Procedure Notes (Signed)
Anesthesia Regional Block: Adductor canal block   Pre-Anesthetic Checklist: ,, timeout performed, Correct Patient, Correct Site, Correct Laterality, Correct Procedure, Correct Position, site marked, Risks and benefits discussed,  Surgical consent,  Pre-op evaluation,  At surgeon's request and post-op pain management  Laterality: Right  Prep: chloraprep       Needles:  Injection technique: Single-shot     Needle Length: 9cm  Needle Gauge: 22     Additional Needles:   Procedures:,,,, ultrasound used (permanent image in chart),,,,  Narrative:  Start time: 02/03/2018 7:05 AM End time: 02/03/2018 7:16 AM Injection made incrementally with aspirations every 5 mL.  Performed by: Personally  Anesthesiologist: Barnet Glasgow, MD

## 2018-02-04 ENCOUNTER — Encounter (HOSPITAL_COMMUNITY): Payer: Self-pay | Admitting: Orthopedic Surgery

## 2018-02-04 DIAGNOSIS — F419 Anxiety disorder, unspecified: Secondary | ICD-10-CM | POA: Diagnosis not present

## 2018-02-04 DIAGNOSIS — E039 Hypothyroidism, unspecified: Secondary | ICD-10-CM | POA: Diagnosis not present

## 2018-02-04 DIAGNOSIS — Z96651 Presence of right artificial knee joint: Secondary | ICD-10-CM | POA: Diagnosis not present

## 2018-02-04 DIAGNOSIS — M1711 Unilateral primary osteoarthritis, right knee: Secondary | ICD-10-CM | POA: Diagnosis not present

## 2018-02-04 DIAGNOSIS — E079 Disorder of thyroid, unspecified: Secondary | ICD-10-CM | POA: Diagnosis not present

## 2018-02-04 DIAGNOSIS — K219 Gastro-esophageal reflux disease without esophagitis: Secondary | ICD-10-CM | POA: Diagnosis not present

## 2018-02-04 DIAGNOSIS — G4730 Sleep apnea, unspecified: Secondary | ICD-10-CM | POA: Diagnosis not present

## 2018-02-04 DIAGNOSIS — R269 Unspecified abnormalities of gait and mobility: Secondary | ICD-10-CM | POA: Diagnosis not present

## 2018-02-04 DIAGNOSIS — Z853 Personal history of malignant neoplasm of breast: Secondary | ICD-10-CM | POA: Diagnosis not present

## 2018-02-04 DIAGNOSIS — I10 Essential (primary) hypertension: Secondary | ICD-10-CM | POA: Diagnosis not present

## 2018-02-04 DIAGNOSIS — Z8571 Personal history of Hodgkin lymphoma: Secondary | ICD-10-CM | POA: Diagnosis not present

## 2018-02-04 NOTE — Care Management Note (Signed)
Case Management Note  Patient Details  Name: Andrea Mendez MRN: [id number] Date of Birth: 10/01/1950  Subjective/Objective:   67 yr old female s/p right total knee arthroplasty.                 Action/Plan:  Case manager spoke with patient's husband concerning discharge plan and DME. Patient was preoperatively setup with [facility], no changes. DME has been delivered to her home.    Expected Discharge Date:  02/04/18               Expected Discharge Plan:  [place]  In-House Referral:  NA  Discharge planning Services  CM Consult  Post Acute Care Choice:  Durable Medical Equipment, Home Health Choice offered to:  Patient, Spouse  DME Arranged:  3-N-1, Walker rolling, CPM DME Agency:  [place] [employer]  HH Arranged:  PT HH Agency:  [place]  Status of Service:  Completed, signed off  If discussed at [place] of Stay Meetings, dates discussed:    Additional Comments:  Ninfa Meeker, RN 02/04/2018, 12:45 PM

## 2018-02-04 NOTE — Discharge Instructions (Signed)
You may bear weight as tolerated. °Keep your dressing on and dry until follow up. °Take medicine to prevent blood clots as directed. °Take pain medicine as needed with the goal of transitioning to over the counter medicines.  °If needed, you may increase pain medication for the first few days post up to 2 tablets every 4 hours. ° °INSTRUCTIONS AFTER JOINT REPLACEMENT  ° °o Remove items at home which could result in a fall. This includes throw rugs or furniture in walking pathways °o ICE to the affected joint every three hours while awake for 30 minutes at a time, for at least the first 3-5 days, and then as needed for pain and swelling.  Continue to use ice for pain and swelling. You may notice swelling that will progress down to the foot and ankle.  This is normal after surgery.  Elevate your leg when you are not up walking on it.   °o Continue to use the breathing machine you got in the hospital (incentive spirometer) which will help keep your temperature down.  It is common for your temperature to cycle up and down following surgery, especially at night when you are not up moving around and exerting yourself.  The breathing machine keeps your lungs expanded and your temperature down. ° ° °DIET:  As you were doing prior to hospitalization, we recommend a well-balanced diet. ° °DRESSING / WOUND CARE / SHOWERING ° °You may shower 3 days after surgery, but keep the wounds dry during showering.  You may use an occlusive plastic wrap (Press'n Seal for example) with blue painter's tape at edges, NO SOAKING/SUBMERGING IN THE BATHTUB.  If the bandage gets wet, change with a clean dry gauze.  If the incision gets wet, pat the wound dry with a clean towel. ° °ACTIVITY ° °o Increase activity slowly as tolerated, but follow the weight bearing instructions below.   °o No driving for 6 weeks or until further direction given by your physician.  You cannot drive while taking narcotics.  °o No lifting or carrying greater than 10  lbs. until further directed by your surgeon. °o Avoid periods of inactivity such as sitting longer than an hour when not asleep. This helps prevent blood clots.  °o You may return to work once you are authorized by your doctor.  ° ° ° °WEIGHT BEARING  ° °Weight bearing as tolerated with assist device (walker, cane, etc) as directed, use it as long as suggested by your surgeon or therapist, typically at least 4-6 weeks. ° ° °EXERCISES ° °Results after joint replacement surgery are often greatly improved when you follow the exercise, range of motion and muscle strengthening exercises prescribed by your doctor. Safety measures are also important to protect the joint from further injury. Any time any of these exercises cause you to have increased pain or swelling, decrease what you are doing until you are comfortable again and then slowly increase them. If you have problems or questions, call your caregiver or physical therapist for advice.  ° °Rehabilitation is important following a joint replacement. After just a few days of immobilization, the muscles of the leg can become weakened and shrink (atrophy).  These exercises are designed to build up the tone and strength of the thigh and leg muscles and to improve motion. Often times heat used for twenty to thirty minutes before working out will loosen up your tissues and help with improving the range of motion but do not use heat for the first two   weeks following surgery (sometimes heat can increase post-operative swelling).  ° °These exercises can be done on a training (exercise) mat, on the floor, on a table or on a bed. Use whatever works the best and is most comfortable for you.    Use music or television while you are exercising so that the exercises are a pleasant break in your day. This will make your life better with the exercises acting as a break in your routine that you can look forward to.   Perform all exercises about fifteen times, three times per day or as  directed.  You should exercise both the operative leg and the other leg as well. ° °Exercises include: °  °• Quad Sets - Tighten up the muscle on the front of the thigh (Quad) and hold for 5-10 seconds.   °• Straight Leg Raises - With your knee straight (if you were given a brace, keep it on), lift the leg to 60 degrees, hold for 3 seconds, and slowly lower the leg.  Perform this exercise against resistance later as your leg gets stronger.  °• Leg Slides: Lying on your back, slowly slide your foot toward your buttocks, bending your knee up off the floor (only go as far as is comfortable). Then slowly slide your foot back down until your leg is flat on the floor again.  °• Angel Wings: Lying on your back spread your legs to the side as far apart as you can without causing discomfort.  °• Hamstring Strength:  Lying on your back, push your heel against the floor with your leg straight by tightening up the muscles of your buttocks.  Repeat, but this time bend your knee to a comfortable angle, and push your heel against the floor.  You may put a pillow under the heel to make it more comfortable if necessary.  ° °A rehabilitation program following joint replacement surgery can speed recovery and prevent re-injury in the future due to weakened muscles. Contact your doctor or a physical therapist for more information on knee rehabilitation.  ° ° °CONSTIPATION ° °Constipation is defined medically as fewer than three stools per week and severe constipation as less than one stool per week.  Even if you have a regular bowel pattern at home, your normal regimen is likely to be disrupted due to multiple reasons following surgery.  Combination of anesthesia, postoperative narcotics, change in appetite and fluid intake all can affect your bowels.  ° °YOU MUST use at least one of the following options; they are listed in order of increasing strength to get the job done.  They are all available over the counter, and you may need to  use some, POSSIBLY even all of these options:   ° °Drink plenty of fluids (prune juice may be helpful) and high fiber foods °Colace 100 mg by mouth twice a day  °Senokot for constipation as directed and as needed Dulcolax (bisacodyl), take with full glass of water  °Miralax (polyethylene glycol) once or twice a day as needed. ° °If you have tried all these things and are unable to have a bowel movement in the first 3-4 days after surgery call either your surgeon or your primary doctor.   ° °If you experience loose stools or diarrhea, hold the medications until you stool forms back up.  If your symptoms do not get better within 1 week or if they get worse, check with your doctor.  If you experience "the worst abdominal pain ever" or   develop nausea or vomiting, please contact the office immediately for further recommendations for treatment. ° ° °ITCHING:  If you experience itching with your medications, try taking only a single pain pill, or even half a pain pill at a time.  You can also use Benadryl over the counter for itching or also to help with sleep.  ° °TED HOSE STOCKINGS:  Use stockings on both legs until for at least 2 weeks or as directed by physician office. They may be removed at night for sleeping. ° °MEDICATIONS:  See your medication summary on the “After Visit Summary” that nursing will review with you.  You may have some home medications which will be placed on hold until you complete the course of blood thinner medication.  It is important for you to complete the blood thinner medication as prescribed. ° °PRECAUTIONS:  If you experience chest pain or shortness of breath - call 911 immediately for transfer to the hospital emergency department.  ° °If you develop a fever greater that 101 F, purulent drainage from wound, increased redness or drainage from wound, foul odor from the wound/dressing, or calf pain - CONTACT YOUR SURGEON.   °                                                °FOLLOW-UP  APPOINTMENTS:  If you do not already have a post-op appointment, please call the office for an appointment to be seen by your surgeon.  Guidelines for how soon to be seen are listed in your “After Visit Summary”, but are typically between 1-4 weeks after surgery. ° °OTHER INSTRUCTIONS:  ° ° ° °MAKE SURE YOU:  °• Understand these instructions.  °• Get help right away if you are not doing well or get worse.  ° ° °Thank you for letting us be a part of your medical care team.  It is a privilege we respect greatly.  We hope these instructions will help you stay on track for a fast and full recovery!  ° ° ° °Information on my medicine - XARELTO® (Rivaroxaban) ° °This medication education was reviewed with me or my healthcare representative as part of my discharge preparation.  The pharmacist that spoke with me during my hospital stay was:  Kerry Chisolm Dien, RPH ° °Why was Xarelto® prescribed for you? °Xarelto® was prescribed for you to reduce the risk of blood clots forming after orthopedic surgery. The medical term for these abnormal blood clots is venous thromboembolism (VTE). ° °What do you need to know about xarelto® ? °Take your Xarelto® ONCE DAILY at the same time every day. °You may take it either with or without food. ° °If you have difficulty swallowing the tablet whole, you may crush it and mix in applesauce just prior to taking your dose. ° °Take Xarelto® exactly as prescribed by your doctor and DO NOT stop taking Xarelto® without talking to the doctor who prescribed the medication.  Stopping without other VTE prevention medication to take the place of Xarelto® may increase your risk of developing a clot. ° °After discharge, you should have regular check-up appointments with your healthcare provider that is prescribing your Xarelto®.   ° °What do you do if you miss a dose? °If you miss a dose, take it as soon as you remember on the same day then continue your regularly scheduled once   daily regimen the next day.  Do not take two doses of Xarelto® on the same day.  ° °Important Safety Information °A possible side effect of Xarelto® is bleeding. You should call your healthcare provider right away if you experience any of the following: °? Bleeding from an injury or your nose that does not stop. °? Unusual colored urine (red or dark brown) or unusual colored stools (red or black). °? Unusual bruising for unknown reasons. °? A serious fall or if you hit your head (even if there is no bleeding). ° °Some medicines may interact with Xarelto® and might increase your risk of bleeding while on Xarelto®. To help avoid this, consult your healthcare provider or pharmacist prior to using any new prescription or non-prescription medications, including herbals, vitamins, non-steroidal anti-inflammatory drugs (NSAIDs) and supplements. ° °This website has more information on Xarelto®: www.xarelto.com. ° ° ° °

## 2018-02-04 NOTE — Evaluation (Signed)
Occupational Therapy Evaluation Patient Details Name: Andrea Mendez MRN: [id number] DOB: 1951-04-15 Today's Date: 02/04/2018    History of Present Illness Pt is a 67 y/o female s/p elective R TKA. PMH includes R breast cancer s/p mastectomy, hodgkins lymphoma, and HTN.    Clinical Impression   This 67 y/o female presents with the above. At baseline pt is mod independent with ADLs and functional mobility using SPC. Pt completed room level functional mobility this session using RW with MinA; currently requires setup-minguard for UB ADLs, modA for LB ADLs secondary to RLE functional limitations. Pt will return home with spouse who is able to provide assist PRN. Will continue to follow while pt remains in acute setting to maximize her safety and independence with ADLs and mobility prior to return home.     Follow Up Recommendations  Follow surgeon's recommendation for DC plan and follow-up therapies;Supervision/Assistance - 24 hour    Equipment Recommendations  3 in 1 bedside commode           Precautions / Restrictions Precautions Precautions: Knee Precaution Comments: Reviewed knee precautions  Restrictions Weight Bearing Restrictions: Yes RLE Weight Bearing: Weight bearing as tolerated      Mobility Bed Mobility               General bed mobility comments: pt sitting EOB upon entering room   Transfers Overall transfer level: Needs assistance Equipment used: Rolling walker (2 wheeled) Transfers: Sit to/from Stand Sit to Stand: Mod assist         General transfer comment: assist to boost to standing from EOB and steady at RW, VCs for safe hand placement     Balance Overall balance assessment: Needs assistance Sitting-balance support: No upper extremity supported;Feet supported Sitting balance-Leahy Scale: Good     Standing balance support: Bilateral upper extremity supported Standing balance-Leahy Scale: Poor Standing balance comment: reliant on UE support  at this time                            ADL either performed or assessed with clinical judgement   ADL Overall ADL's : Needs assistance/impaired Eating/Feeding: Modified independent;Sitting   Grooming: Wash/dry face;Oral care;Minimal assistance;Min guard;Standing Grooming Details (indicate cue type and reason): pt using UE (forearm) support on sink during task completion  Upper Body Bathing: Set up;Min guard;Sitting   Lower Body Bathing: Minimal assistance;Sit to/from stand   Upper Body Dressing : Sitting;Set up   Lower Body Dressing: Moderate assistance;Sit to/from stand Lower Body Dressing Details (indicate cue type and reason): educated on donning operated LE first  Toilet Transfer: Minimal assistance;Ambulation;BSC;RW Toilet Transfer Details (indicate cue type and reason): BSC over toilet Toileting- Clothing Manipulation and Hygiene: Minimal assistance;Sit to/from Nurse, children's Details (indicate cue type and reason): pt reports plans to sponge bathe initially due to not having waterproof dressing; pt also reports she does not plan to complete tub transfer until she improves in her mobility  Functional mobility during ADLs: Minimal assistance;Rolling walker       Pertinent Vitals/Pain Pain Assessment: Faces Faces Pain Scale: Hurts little more Pain Location: R knee  Pain Descriptors / Indicators: Aching;Operative site guarding Pain Intervention(s): Monitored during session;Repositioned          Extremity/Trunk Assessment Upper Extremity Assessment Upper Extremity Assessment: Overall WFL for tasks assessed   Lower Extremity Assessment Lower Extremity Assessment: Defer to PT evaluation   Cervical / Trunk Assessment Cervical / Trunk  Assessment: Normal   Communication Communication Communication: No difficulties   Cognition Arousal/Alertness: Awake/alert Behavior During Therapy: WFL for tasks assessed/performed Overall Cognitive Status:  Within Functional Limits for tasks assessed                                                Home Living Family/patient expects to be discharged to:: Private residence Living Arrangements: Spouse/significant other Available Help at Discharge: Family;Available 24 hours/day Type of Home: Apartment Home Access: Level entry     Home Layout: One level     Bathroom Shower/Tub: Tub/shower unit(garden tub)   Bathroom Toilet: Standard     Home Equipment: Walker - 2 wheels;Grab bars - tub/shower          Prior Functioning/Environment Level of Independence: Independent with assistive device(s)        Comments: Used cane for ambulation; rollator for longer distances         OT Problem List: Decreased strength;Impaired balance (sitting and/or standing);Decreased range of motion;Decreased activity tolerance;Decreased knowledge of use of DME or AE;Pain      OT Treatment/Interventions: Self-care/ADL training;DME and/or AE instruction;Therapeutic activities;Balance training;Therapeutic exercise;Patient/family education    OT Goals(Current goals can be found in the care plan section) Acute Rehab OT Goals Patient Stated Goal: to go home  OT Goal Formulation: With patient Time For Goal Achievement: 02/18/18 Potential to Achieve Goals: Good  OT Frequency: Min 2X/week                             AM-PAC PT "6 Clicks" Daily Activity     Outcome Measure Help from another person eating meals?: None Help from another person taking care of personal grooming?: A Little Help from another person toileting, which includes using toliet, bedpan, or urinal?: A Little Help from another person bathing (including washing, rinsing, drying)?: A Lot Help from another person to put on and taking off regular upper body clothing?: None Help from another person to put on and taking off regular lower body clothing?: A Lot 6 Click Score: 18   End of Session Equipment Utilized  During Treatment: Gait belt;Rolling walker Nurse Communication: Mobility status;Other (comment)(pt seated in bathroom)  Activity Tolerance: Patient tolerated treatment well Patient left: Other (comment);with call bell/phone within reach;with family/visitor present(seated in bathroom to complete sponge bathing with spouse supervision )  OT Visit Diagnosis: Other abnormalities of gait and mobility (R26.89)                Time: 2595-6387 OT Time Calculation (min): 32 min Charges:  OT General Charges $OT Visit: 1 Visit OT Evaluation $OT Eval Low Complexity: 1 Low OT Treatments $Self Care/Home Management : 8-22 mins G-Codes:     Lou Cal, OT Pager [phone] 02/04/2018   Raymondo Band 02/04/2018, 9:31 AM

## 2018-02-04 NOTE — Progress Notes (Signed)
Discharge instructions completed with pt and her husband.  Pt and her husband verbalized understanding of the instructions.  Pt alert and oriented x4.  Pt denies chest pain, shortness of breath, dizziness, lightheadedness, and n/v.  Pt discharged home.

## 2018-02-04 NOTE — Discharge Summary (Signed)
Discharge Summary  Patient ID: Andrea Mendez MRN: [id number] DOB/AGE: [birth year] 68 y.o.  Admit date: 02/03/2018 Discharge date: 02/04/2018  Admission Diagnoses:  Primary osteoarthritis of right knee  Discharge Diagnoses:  Principal Problem:   Primary osteoarthritis of right knee Active Problems:   History of right breast cancer   History of gastrointestinal stromal tumor (GIST)   Hypertension   History of Hodgkin's lymphoma   History of gastric surgery   Primary osteoarthritis of knee   Past Medical History:  Diagnosis Date  . Anemia    with Hodgkin's  . Anxiety   . Asthma    "they once said I had asthma"  . Blood transfusion without reported diagnosis   . Breast cancer ([place])    right breast  . Cancer ([place])    Hodgkin lymphoma  . GERD (gastroesophageal reflux disease)   . History of chemotherapy   . History of kidney stones   . Hypertension   . Hypothyroidism   . Multiple thyroid nodules    left and right  . Sleep apnea    stopped using CPAP 2012    Surgeries: Procedure(s): RIGHT TOTAL KNEE ARTHROPLASTY on 02/03/2018   Consultants (if any):   Discharged Condition: Improved  Hospital Course: Andrea Mendez is an 67 y.o. female who was admitted 02/03/2018 with a diagnosis of Primary osteoarthritis of right knee and went to the operating room on 02/03/2018 and underwent the above named procedures.    She was given perioperative antibiotics:  Anti-infectives (From admission, onward)   Start     Dose/Rate Route Frequency Ordered Stop   02/03/18 1300  ceFAZolin (ANCEF) IVPB 2g/100 mL premix     2 g 200 mL/hr over 30 Minutes Intravenous Every 6 hours 02/03/18 1101 02/03/18 1847   02/03/18 0600  ceFAZolin (ANCEF) IVPB 2g/100 mL premix     2 g 200 mL/hr over 30 Minutes Intravenous To ShortStay Surgical 02/02/18 1156 02/03/18 0740    .  She was given sequential compression devices, early ambulation, and Xarelto for DVT prophylaxis.  She benefited maximally  from the hospital stay and there were no complications.    Recent vital signs:  Vitals:   02/03/18 2114 02/04/18 0422  BP: 123/78 (!) 115/55  Pulse: (!) 50 (!) 51  Resp: 18   Temp: (!) 97.5 F (36.4 C) (!) 97.5 F (36.4 C)  SpO2: 97% 98%    Recent laboratory studies:  Lab Results  Component Value Date   HGB 12.5 01/23/2018   HGB 12.7 03/20/2017   HGB 11.8 (L) 10/14/2016   Lab Results  Component Value Date   WBC 6.9 01/23/2018   PLT 330 01/23/2018   Lab Results  Component Value Date   INR 0.97 10/14/2016   Lab Results  Component Value Date   NA 141 01/23/2018   K 4.8 01/23/2018   CL 104 01/23/2018   CO2 25 01/23/2018   BUN 10 01/23/2018   CREATININE 0.57 01/23/2018   GLUCOSE 90 01/23/2018    Discharge Medications:   Allergies as of 02/04/2018      Reactions   Latex Itching, Rash      Medication List    TAKE these medications   acetaminophen 500 MG tablet Commonly known as:  TYLENOL Take 2 tablets (1,000 mg total) by mouth every 8 (eight) hours for 14 days. For Pain.   alendronate 35 MG tablet Commonly known as:  FOSAMAX Take 35 mg by mouth every Sunday. Take with a full glass of water  on an empty stomach.   amLODipine 10 MG tablet Commonly known as:  NORVASC Take 10 mg by mouth daily.   CALCIUM-VITAMIN D3 PO Take 1 tablet by mouth 2 (two) times daily.   cholecalciferol 1000 units tablet Commonly known as:  VITAMIN D Take 1,000 Units by mouth daily.   docusate sodium 100 MG capsule Commonly known as:  COLACE Take 1 capsule (100 mg total) by mouth 2 (two) times daily. To prevent constipation while taking pain medication.   levothyroxine 100 MCG tablet Commonly known as:  SYNTHROID, LEVOTHROID Take 100 mcg by mouth daily before breakfast.   methocarbamol 500 MG tablet Commonly known as:  ROBAXIN Take 1 tablet (500 mg total) by mouth every 6 (six) hours as needed for muscle spasms.   metoprolol tartrate 50 MG tablet Commonly known as:   LOPRESSOR Take 50 mg by mouth 2 (two) times daily.   montelukast 10 MG tablet Commonly known as:  SINGULAIR Take 10 mg by mouth at bedtime.   multivitamin tablet Take 2 tablets by mouth daily.   OMEGA-3 PO Take 1 capsule by mouth daily.   omeprazole 40 MG capsule Commonly known as:  PRILOSEC Take 1 capsule (40 mg total) by mouth daily. Take one capsule 20-30 minutes before your first meal of the day.   ondansetron 4 MG tablet Commonly known as:  ZOFRAN Take 1 tablet (4 mg total) by mouth every 8 (eight) hours as needed for nausea or vomiting.   oxyCODONE 5 MG immediate release tablet Commonly known as:  ROXICODONE Take 1 tablet (5 mg total) by mouth every 4 (four) hours as needed for breakthrough pain.   rivaroxaban 10 MG Tabs tablet Commonly known as:  XARELTO Take 1 tablet (10 mg total) by mouth daily. For 30 days for DVT prophylaxis   SYSTANE OP Place 2 drops into both eyes daily as needed (for dry eyes).   VITAMIN E PO Take 1 capsule by mouth daily.       Diagnostic Studies: Dg Knee Right Port  Result Date: 02/03/2018 CLINICAL DATA:  67 year old female post right knee replacement. Initial encounter. EXAM: PORTABLE RIGHT KNEE - 1-2 VIEW COMPARISON:  None. FINDINGS: Post total right knee replacement which appears in satisfactory position without complication noted. IMPRESSION: Post total right knee replacement. Electronically Signed   By: Genia Del M.D.   On: 02/03/2018 11:17    Disposition: Discharge disposition: 01-Home or Self Care       Discharge Instructions    Discharge patient   Complete by:  As directed    Discharge disposition:  01-Home or Self Care   Discharge patient date:  02/04/2018      Follow-up Information    Andrea Butters, MD Follow up.   Specialty:  Orthopedic Surgery Contact information: [address] [id number] [phone]            Signed: Prudencio Burly III PA-C 02/04/2018, 7:58  AM

## 2018-02-04 NOTE — Progress Notes (Signed)
Physical Therapy Treatment Patient Details Name: Andrea Mendez MRN: [id number] DOB: [birth year] Today's Date: 02/04/2018    History of Present Illness Pt is a 67 y/o female s/p elective R TKA. PMH includes R breast cancer s/p mastectomy, hodgkins lymphoma, and HTN.     PT Comments    Pt is POD #1 and has progressed nicely with gait an mobility at a min guard assist level. We did practice the curb step to save her time and energy at home as she would have to walk further to get into the house if she had to go to the [facility] curb cut out.  Entire HEP reviewed as well as knee precautions.  Pt has met all therapy goals.  PT to follow acutely until d/c confirmed.      Follow Up Recommendations  Follow surgeon's recommendation for DC plan and follow-up therapies;Supervision for mobility/OOB     Equipment Recommendations  3in1 (PT)    Recommendations for Other Services   NA     Precautions / Restrictions Precautions Precautions: Knee Precaution Booklet Issued: Yes (comment) Precaution Comments: Reviewed knee precautions and entire HEP Restrictions RLE Weight Bearing: Weight bearing as tolerated    Mobility  Bed Mobility Overal bed mobility: Needs Assistance Bed Mobility: Supine to Sit     Supine to sit: Min assist     General bed mobility comments: Min assist to help progress right leg to EOB.   Transfers Overall transfer level: Needs assistance Equipment used: Rolling walker (2 wheeled) Transfers: Sit to/from Stand Sit to Stand: Min guard         General transfer comment: Min guard assist for safety, cues for safe hand placement.   Ambulation/Gait Ambulation/Gait assistance: Min guard Ambulation Distance (Feet): 180 Feet Assistive device: Rolling walker (2 wheeled) Gait Pattern/deviations: Step-through pattern;Antalgic     General Gait Details: Mildly antalgic gait pattern, cues for upright posture.    Stairs Stairs: Yes Stairs assistance: Min guard Stair  Management: No rails;Step to pattern;Forwards Number of Stairs: 1 General stair comments: Practiced one curb step so that she could get up the curb without having to walk all the way around to the [facility] cut out to get into her apartment.           Balance Overall balance assessment: Needs assistance Sitting-balance support: No upper extremity supported Sitting balance-Leahy Scale: Good     Standing balance support: Bilateral upper extremity supported Standing balance-Leahy Scale: Fair                              Cognition Arousal/Alertness: Awake/alert Behavior During Therapy: WFL for tasks assessed/performed Overall Cognitive Status: Within Functional Limits for tasks assessed                                        Exercises Total Joint Exercises Ankle Circles/Pumps: AROM;Both;20 reps Quad Sets: AROM;10 reps;Right Towel Squeeze: AROM;Both;10 reps Short Arc Quad: AROM;Right;10 reps Heel Slides: AAROM;Right;10 reps Hip ABduction/ADduction: AAROM;Right;10 reps Straight Leg Raises: AAROM;Right;10 reps Long Arc Quad: AROM;Right;10 reps Knee Flexion: AROM;AAROM;10 reps;Seated Goniometric ROM: 8-82        Pertinent Vitals/Pain Pain Assessment: Faces Faces Pain Scale: Hurts even more Pain Location: R knee  Pain Descriptors / Indicators: Grimacing;Guarding Pain Intervention(s): Limited activity within patient's tolerance;Monitored during session;Repositioned  PT Goals (current goals can now be found in the care plan section) Acute Rehab PT Goals Patient Stated Goal: to go home  Progress towards PT goals: Progressing toward goals    Frequency    7X/week      PT Plan Current plan remains appropriate       AM-PAC PT "6 Clicks" Daily Activity  Outcome Measure  Difficulty turning over in bed (including adjusting bedclothes, sheets and blankets)?: A Little Difficulty moving from lying on back to sitting on the side of the  bed? : A Little Difficulty sitting down on and standing up from a chair with arms (e.g., wheelchair, bedside commode, etc,.)?: A Little Help needed moving to and from a bed to chair (including a wheelchair)?: A Little Help needed walking in hospital room?: A Little Help needed climbing 3-5 steps with a railing? : A Little 6 Click Score: 18    End of Session Equipment Utilized During Treatment: Right knee immobilizer Activity Tolerance: Patient limited by pain;Patient limited by fatigue Patient left: in chair;with call bell/phone within reach Nurse Communication: Mobility status PT Visit Diagnosis: Other abnormalities of gait and mobility (R26.89);Pain Pain - Right/Left: Right Pain - part of body: Knee     Time: 1217-1255 PT Time Calculation (min) (ACUTE ONLY): 38 min  Charges:  $Gait Training: 23-37 mins $Therapeutic Exercise: 8-22 mins           Marton Malizia B. [place], [place], DPT [phone]   02/04/2018, 2:12 PM

## 2018-02-04 NOTE — Progress Notes (Signed)
    Subjective: Patient reports pain as mild to moderate.  Tolerating some diet.  Urinating.   No CP, SOB.     Objective:   VITALS:   Vitals:   02/03/18 1053 02/03/18 2111 02/03/18 2114 02/04/18 0422  BP: (!) 147/91 123/78 123/78 (!) 115/55  Pulse: (!) 59 60 (!) 50 (!) 51  Resp:   18   Temp: (!) 97.5 F (36.4 C)  (!) 97.5 F (36.4 C) (!) 97.5 F (36.4 C)  TempSrc: Oral  Oral Oral  SpO2: (!) 85%  97% 98%  Weight:      Height:       CBC Latest Ref Rng & Units 01/23/2018 03/20/2017 10/14/2016  WBC 4.0 - 10.5 K/uL 6.9 6.8 7.9  Hemoglobin 12.0 - 15.0 g/dL 12.5 12.7 11.8(L)  Hematocrit 36.0 - 46.0 % 38.1 38.8 35.8(L)  Platelets 150 - 400 K/uL 330 318 348   BMP Latest Ref Rng & Units 01/23/2018 03/20/2017 09/20/2016  Glucose 65 - 99 mg/dL 90 98 110  BUN 6 - 20 mg/dL 10 15.5 11.8  Creatinine 0.44 - 1.00 mg/dL 0.57 1.0 0.7  Sodium 135 - 145 mmol/L 141 144 142  Potassium 3.5 - 5.1 mmol/L 4.8 3.8 3.5  Chloride 101 - 111 mmol/L 104 - -  CO2 22 - 32 mmol/L 25 28 26   Calcium 8.9 - 10.3 mg/dL 9.6 10.0 9.3   Intake/Output      05/07 0701 - 05/08 0700 05/08 0701 - 05/09 0700   P.O. 240    I.V. (mL/kg) 1000 (10.8)    Total Intake(mL/kg) 1240 (13.4)    Urine (mL/kg/hr) 900 (0.4)    Blood 20    Total Output 920    Net +320         Urine Occurrence 1 x       Physical Exam: General: NAD.  Supine in bed.  Sleepy but conversant.  Husband at bedside.  No increased work of breathing. Neurologically intact MSK RLE: Neurovascularly intact Sensation intact distally Feet warm Dorsiflexion/Plantar flexion intact Incision: dressing C/D/I   Assessment: 1 Day Post-Op  S/P Procedure(s) (LRB): RIGHT TOTAL KNEE ARTHROPLASTY (Right) by Dr. Ernesta Amble. Percell Miller on 02/03/2018  Principal Problem:   Primary osteoarthritis of right knee Active Problems:   History of right breast cancer   History of gastrointestinal stromal tumor (GIST)   Hypertension   History of Hodgkin's lymphoma   History  of gastric surgery   Primary osteoarthritis of knee   Primary osteoarthritis, status post right total knee arthroplasty Doing well postop day 1 Eating, drinking, and voiding. Some early mobilization. Pain controlled.  Plan: Advance diet Up with therapy Incentive Spirometry Elevate and Apply ice CPM, bone foam  Weight Bearing: Weight Bearing as Tolerated (WBAT) RLE Dressings: Maintain Mepilex.  Please apply thigh high TED hose to operative leg prior to discharge. VTE prophylaxis: Xarelto, SCDs, ambulation Dispo: Home after therapy    Patient's anticipated LOS is less than 2 midnights, meeting these requirements: - Lives within 1 hour of care - Has a competent adult at home to recover with post-op recover   Andrea Burly III, PA-C 02/04/2018, 7:54 AM

## 2018-02-05 DIAGNOSIS — Z471 Aftercare following joint replacement surgery: Secondary | ICD-10-CM | POA: Diagnosis not present

## 2018-02-05 DIAGNOSIS — Z96651 Presence of right artificial knee joint: Secondary | ICD-10-CM | POA: Diagnosis not present

## 2018-02-05 DIAGNOSIS — I10 Essential (primary) hypertension: Secondary | ICD-10-CM | POA: Diagnosis not present

## 2018-02-06 DIAGNOSIS — Z96651 Presence of right artificial knee joint: Secondary | ICD-10-CM | POA: Diagnosis not present

## 2018-02-06 DIAGNOSIS — I10 Essential (primary) hypertension: Secondary | ICD-10-CM | POA: Diagnosis not present

## 2018-02-06 DIAGNOSIS — Z471 Aftercare following joint replacement surgery: Secondary | ICD-10-CM | POA: Diagnosis not present

## 2018-02-09 DIAGNOSIS — Z96651 Presence of right artificial knee joint: Secondary | ICD-10-CM | POA: Diagnosis not present

## 2018-02-09 DIAGNOSIS — I10 Essential (primary) hypertension: Secondary | ICD-10-CM | POA: Diagnosis not present

## 2018-02-09 DIAGNOSIS — Z471 Aftercare following joint replacement surgery: Secondary | ICD-10-CM | POA: Diagnosis not present

## 2018-02-11 DIAGNOSIS — Z471 Aftercare following joint replacement surgery: Secondary | ICD-10-CM | POA: Diagnosis not present

## 2018-02-11 DIAGNOSIS — I10 Essential (primary) hypertension: Secondary | ICD-10-CM | POA: Diagnosis not present

## 2018-02-11 DIAGNOSIS — Z96651 Presence of right artificial knee joint: Secondary | ICD-10-CM | POA: Diagnosis not present

## 2018-02-13 DIAGNOSIS — I10 Essential (primary) hypertension: Secondary | ICD-10-CM | POA: Diagnosis not present

## 2018-02-13 DIAGNOSIS — Z471 Aftercare following joint replacement surgery: Secondary | ICD-10-CM | POA: Diagnosis not present

## 2018-02-13 DIAGNOSIS — Z96651 Presence of right artificial knee joint: Secondary | ICD-10-CM | POA: Diagnosis not present

## 2018-02-16 DIAGNOSIS — Z471 Aftercare following joint replacement surgery: Secondary | ICD-10-CM | POA: Diagnosis not present

## 2018-02-16 DIAGNOSIS — I10 Essential (primary) hypertension: Secondary | ICD-10-CM | POA: Diagnosis not present

## 2018-02-16 DIAGNOSIS — Z96651 Presence of right artificial knee joint: Secondary | ICD-10-CM | POA: Diagnosis not present

## 2018-02-18 DIAGNOSIS — M1711 Unilateral primary osteoarthritis, right knee: Secondary | ICD-10-CM | POA: Diagnosis not present

## 2018-02-19 DIAGNOSIS — Z471 Aftercare following joint replacement surgery: Secondary | ICD-10-CM | POA: Diagnosis not present

## 2018-02-19 DIAGNOSIS — I10 Essential (primary) hypertension: Secondary | ICD-10-CM | POA: Diagnosis not present

## 2018-02-19 DIAGNOSIS — Z96651 Presence of right artificial knee joint: Secondary | ICD-10-CM | POA: Diagnosis not present

## 2018-02-24 DIAGNOSIS — Z96651 Presence of right artificial knee joint: Secondary | ICD-10-CM | POA: Diagnosis not present

## 2018-02-24 DIAGNOSIS — Z471 Aftercare following joint replacement surgery: Secondary | ICD-10-CM | POA: Diagnosis not present

## 2018-02-24 DIAGNOSIS — I10 Essential (primary) hypertension: Secondary | ICD-10-CM | POA: Diagnosis not present

## 2018-02-26 DIAGNOSIS — M1711 Unilateral primary osteoarthritis, right knee: Secondary | ICD-10-CM | POA: Diagnosis not present

## 2018-02-26 DIAGNOSIS — M25561 Pain in right knee: Secondary | ICD-10-CM | POA: Diagnosis not present

## 2018-02-26 DIAGNOSIS — R262 Difficulty in walking, not elsewhere classified: Secondary | ICD-10-CM | POA: Diagnosis not present

## 2018-02-26 DIAGNOSIS — Z471 Aftercare following joint replacement surgery: Secondary | ICD-10-CM | POA: Diagnosis not present

## 2018-02-27 DIAGNOSIS — M1711 Unilateral primary osteoarthritis, right knee: Secondary | ICD-10-CM | POA: Diagnosis not present

## 2018-02-27 DIAGNOSIS — M25561 Pain in right knee: Secondary | ICD-10-CM | POA: Diagnosis not present

## 2018-02-27 DIAGNOSIS — Z471 Aftercare following joint replacement surgery: Secondary | ICD-10-CM | POA: Diagnosis not present

## 2018-02-27 DIAGNOSIS — R262 Difficulty in walking, not elsewhere classified: Secondary | ICD-10-CM | POA: Diagnosis not present

## 2018-03-02 DIAGNOSIS — Z471 Aftercare following joint replacement surgery: Secondary | ICD-10-CM | POA: Diagnosis not present

## 2018-03-02 DIAGNOSIS — M25561 Pain in right knee: Secondary | ICD-10-CM | POA: Diagnosis not present

## 2018-03-02 DIAGNOSIS — R262 Difficulty in walking, not elsewhere classified: Secondary | ICD-10-CM | POA: Diagnosis not present

## 2018-03-02 DIAGNOSIS — M1711 Unilateral primary osteoarthritis, right knee: Secondary | ICD-10-CM | POA: Diagnosis not present

## 2018-03-03 DIAGNOSIS — M1711 Unilateral primary osteoarthritis, right knee: Secondary | ICD-10-CM | POA: Diagnosis not present

## 2018-03-04 DIAGNOSIS — M1711 Unilateral primary osteoarthritis, right knee: Secondary | ICD-10-CM | POA: Diagnosis not present

## 2018-03-04 DIAGNOSIS — R9431 Abnormal electrocardiogram [ECG] [EKG]: Secondary | ICD-10-CM | POA: Diagnosis not present

## 2018-03-04 DIAGNOSIS — M25561 Pain in right knee: Secondary | ICD-10-CM | POA: Diagnosis not present

## 2018-03-04 DIAGNOSIS — Z01810 Encounter for preprocedural cardiovascular examination: Secondary | ICD-10-CM | POA: Diagnosis not present

## 2018-03-04 DIAGNOSIS — R262 Difficulty in walking, not elsewhere classified: Secondary | ICD-10-CM | POA: Diagnosis not present

## 2018-03-04 DIAGNOSIS — Z471 Aftercare following joint replacement surgery: Secondary | ICD-10-CM | POA: Diagnosis not present

## 2018-03-06 DIAGNOSIS — M25561 Pain in right knee: Secondary | ICD-10-CM | POA: Diagnosis not present

## 2018-03-06 DIAGNOSIS — Z471 Aftercare following joint replacement surgery: Secondary | ICD-10-CM | POA: Diagnosis not present

## 2018-03-06 DIAGNOSIS — R262 Difficulty in walking, not elsewhere classified: Secondary | ICD-10-CM | POA: Diagnosis not present

## 2018-03-06 DIAGNOSIS — M1711 Unilateral primary osteoarthritis, right knee: Secondary | ICD-10-CM | POA: Diagnosis not present

## 2018-03-09 DIAGNOSIS — R262 Difficulty in walking, not elsewhere classified: Secondary | ICD-10-CM | POA: Diagnosis not present

## 2018-03-09 DIAGNOSIS — M1711 Unilateral primary osteoarthritis, right knee: Secondary | ICD-10-CM | POA: Diagnosis not present

## 2018-03-09 DIAGNOSIS — Z471 Aftercare following joint replacement surgery: Secondary | ICD-10-CM | POA: Diagnosis not present

## 2018-03-09 DIAGNOSIS — M25561 Pain in right knee: Secondary | ICD-10-CM | POA: Diagnosis not present

## 2018-03-11 DIAGNOSIS — M170 Bilateral primary osteoarthritis of knee: Secondary | ICD-10-CM | POA: Diagnosis not present

## 2018-03-11 DIAGNOSIS — R9431 Abnormal electrocardiogram [ECG] [EKG]: Secondary | ICD-10-CM | POA: Diagnosis not present

## 2018-03-11 DIAGNOSIS — M1711 Unilateral primary osteoarthritis, right knee: Secondary | ICD-10-CM | POA: Diagnosis not present

## 2018-03-11 DIAGNOSIS — I10 Essential (primary) hypertension: Secondary | ICD-10-CM | POA: Diagnosis not present

## 2018-03-11 DIAGNOSIS — Z471 Aftercare following joint replacement surgery: Secondary | ICD-10-CM | POA: Diagnosis not present

## 2018-03-11 DIAGNOSIS — R262 Difficulty in walking, not elsewhere classified: Secondary | ICD-10-CM | POA: Diagnosis not present

## 2018-03-11 DIAGNOSIS — M25561 Pain in right knee: Secondary | ICD-10-CM | POA: Diagnosis not present

## 2018-03-13 DIAGNOSIS — M1711 Unilateral primary osteoarthritis, right knee: Secondary | ICD-10-CM | POA: Diagnosis not present

## 2018-03-13 DIAGNOSIS — Z471 Aftercare following joint replacement surgery: Secondary | ICD-10-CM | POA: Diagnosis not present

## 2018-03-13 DIAGNOSIS — R262 Difficulty in walking, not elsewhere classified: Secondary | ICD-10-CM | POA: Diagnosis not present

## 2018-03-13 DIAGNOSIS — M25561 Pain in right knee: Secondary | ICD-10-CM | POA: Diagnosis not present

## 2018-03-16 DIAGNOSIS — M25561 Pain in right knee: Secondary | ICD-10-CM | POA: Diagnosis not present

## 2018-03-16 DIAGNOSIS — Z471 Aftercare following joint replacement surgery: Secondary | ICD-10-CM | POA: Diagnosis not present

## 2018-03-16 DIAGNOSIS — R262 Difficulty in walking, not elsewhere classified: Secondary | ICD-10-CM | POA: Diagnosis not present

## 2018-03-16 DIAGNOSIS — M1711 Unilateral primary osteoarthritis, right knee: Secondary | ICD-10-CM | POA: Diagnosis not present

## 2018-03-18 DIAGNOSIS — R262 Difficulty in walking, not elsewhere classified: Secondary | ICD-10-CM | POA: Diagnosis not present

## 2018-03-18 DIAGNOSIS — M25561 Pain in right knee: Secondary | ICD-10-CM | POA: Diagnosis not present

## 2018-03-18 DIAGNOSIS — M1711 Unilateral primary osteoarthritis, right knee: Secondary | ICD-10-CM | POA: Diagnosis not present

## 2018-03-18 DIAGNOSIS — Z471 Aftercare following joint replacement surgery: Secondary | ICD-10-CM | POA: Diagnosis not present

## 2018-03-20 ENCOUNTER — Ambulatory Visit: Payer: Medicare HMO | Admitting: Hematology and Oncology

## 2018-03-20 ENCOUNTER — Other Ambulatory Visit: Payer: Medicare HMO

## 2018-03-20 DIAGNOSIS — M25561 Pain in right knee: Secondary | ICD-10-CM | POA: Diagnosis not present

## 2018-03-20 DIAGNOSIS — R262 Difficulty in walking, not elsewhere classified: Secondary | ICD-10-CM | POA: Diagnosis not present

## 2018-03-20 DIAGNOSIS — M1711 Unilateral primary osteoarthritis, right knee: Secondary | ICD-10-CM | POA: Diagnosis not present

## 2018-03-20 DIAGNOSIS — Z471 Aftercare following joint replacement surgery: Secondary | ICD-10-CM | POA: Diagnosis not present

## 2018-03-23 DIAGNOSIS — R262 Difficulty in walking, not elsewhere classified: Secondary | ICD-10-CM | POA: Diagnosis not present

## 2018-03-23 DIAGNOSIS — M25561 Pain in right knee: Secondary | ICD-10-CM | POA: Diagnosis not present

## 2018-03-23 DIAGNOSIS — Z471 Aftercare following joint replacement surgery: Secondary | ICD-10-CM | POA: Diagnosis not present

## 2018-03-23 DIAGNOSIS — M1711 Unilateral primary osteoarthritis, right knee: Secondary | ICD-10-CM | POA: Diagnosis not present

## 2018-03-25 DIAGNOSIS — M25561 Pain in right knee: Secondary | ICD-10-CM | POA: Diagnosis not present

## 2018-03-25 DIAGNOSIS — M1711 Unilateral primary osteoarthritis, right knee: Secondary | ICD-10-CM | POA: Diagnosis not present

## 2018-03-25 DIAGNOSIS — R262 Difficulty in walking, not elsewhere classified: Secondary | ICD-10-CM | POA: Diagnosis not present

## 2018-03-25 DIAGNOSIS — Z471 Aftercare following joint replacement surgery: Secondary | ICD-10-CM | POA: Diagnosis not present

## 2018-03-26 DIAGNOSIS — C50019 Malignant neoplasm of nipple and areola, unspecified female breast: Secondary | ICD-10-CM | POA: Diagnosis not present

## 2018-03-26 DIAGNOSIS — I10 Essential (primary) hypertension: Secondary | ICD-10-CM | POA: Diagnosis not present

## 2018-03-26 DIAGNOSIS — E039 Hypothyroidism, unspecified: Secondary | ICD-10-CM | POA: Diagnosis not present

## 2018-03-26 DIAGNOSIS — M130 Polyarthritis, unspecified: Secondary | ICD-10-CM | POA: Diagnosis not present

## 2018-03-26 DIAGNOSIS — C8194 Hodgkin lymphoma, unspecified, lymph nodes of axilla and upper limb: Secondary | ICD-10-CM | POA: Diagnosis not present

## 2018-03-30 DIAGNOSIS — R262 Difficulty in walking, not elsewhere classified: Secondary | ICD-10-CM | POA: Diagnosis not present

## 2018-03-30 DIAGNOSIS — M25561 Pain in right knee: Secondary | ICD-10-CM | POA: Diagnosis not present

## 2018-03-30 DIAGNOSIS — Z471 Aftercare following joint replacement surgery: Secondary | ICD-10-CM | POA: Diagnosis not present

## 2018-03-30 DIAGNOSIS — M1711 Unilateral primary osteoarthritis, right knee: Secondary | ICD-10-CM | POA: Diagnosis not present

## 2018-04-01 DIAGNOSIS — M1711 Unilateral primary osteoarthritis, right knee: Secondary | ICD-10-CM | POA: Diagnosis not present

## 2018-04-01 DIAGNOSIS — M25561 Pain in right knee: Secondary | ICD-10-CM | POA: Diagnosis not present

## 2018-04-01 DIAGNOSIS — I10 Essential (primary) hypertension: Secondary | ICD-10-CM | POA: Diagnosis not present

## 2018-04-01 DIAGNOSIS — Z471 Aftercare following joint replacement surgery: Secondary | ICD-10-CM | POA: Diagnosis not present

## 2018-04-01 DIAGNOSIS — E041 Nontoxic single thyroid nodule: Secondary | ICD-10-CM | POA: Diagnosis not present

## 2018-04-01 DIAGNOSIS — E039 Hypothyroidism, unspecified: Secondary | ICD-10-CM | POA: Diagnosis not present

## 2018-04-01 DIAGNOSIS — R262 Difficulty in walking, not elsewhere classified: Secondary | ICD-10-CM | POA: Diagnosis not present

## 2018-04-03 ENCOUNTER — Other Ambulatory Visit: Payer: Self-pay | Admitting: Endocrinology

## 2018-04-03 DIAGNOSIS — E041 Nontoxic single thyroid nodule: Secondary | ICD-10-CM

## 2018-04-06 ENCOUNTER — Inpatient Hospital Stay (HOSPITAL_BASED_OUTPATIENT_CLINIC_OR_DEPARTMENT_OTHER): Payer: Medicare HMO | Admitting: Hematology and Oncology

## 2018-04-06 ENCOUNTER — Encounter: Payer: Self-pay | Admitting: Hematology and Oncology

## 2018-04-06 ENCOUNTER — Inpatient Hospital Stay: Payer: Medicare HMO | Attending: Hematology and Oncology

## 2018-04-06 DIAGNOSIS — Z8509 Personal history of malignant neoplasm of other digestive organs: Secondary | ICD-10-CM

## 2018-04-06 DIAGNOSIS — Z8571 Personal history of Hodgkin lymphoma: Secondary | ICD-10-CM | POA: Insufficient documentation

## 2018-04-06 DIAGNOSIS — C8198 Hodgkin lymphoma, unspecified, lymph nodes of multiple sites: Secondary | ICD-10-CM

## 2018-04-06 DIAGNOSIS — Z853 Personal history of malignant neoplasm of breast: Secondary | ICD-10-CM | POA: Insufficient documentation

## 2018-04-06 DIAGNOSIS — Z86018 Personal history of other benign neoplasm: Secondary | ICD-10-CM | POA: Diagnosis not present

## 2018-04-06 LAB — COMPREHENSIVE METABOLIC PANEL
ALBUMIN: 4.1 g/dL (ref 3.5–5.0)
ALT: 10 U/L (ref 0–44)
AST: 15 U/L (ref 15–41)
Alkaline Phosphatase: 111 U/L (ref 38–126)
Anion gap: 7 (ref 5–15)
BUN: 11 mg/dL (ref 8–23)
CHLORIDE: 105 mmol/L (ref 98–111)
CO2: 31 mmol/L (ref 22–32)
CREATININE: 0.74 mg/dL (ref 0.44–1.00)
Calcium: 10.3 mg/dL (ref 8.9–10.3)
GFR calc Af Amer: >60 mL/min (ref >60)
GFR calc non Af Amer: >60 mL/min (ref >60)
GLUCOSE: 85 mg/dL (ref 70–99)
POTASSIUM: 4 mmol/L (ref 3.5–5.1)
Sodium: 143 mmol/L (ref 135–145)
Total Bilirubin: 0.6 mg/dL (ref 0.3–1.2)
Total Protein: 7.8 g/dL (ref 6.5–8.1)

## 2018-04-06 LAB — CBC WITH DIFFERENTIAL/PLATELET
Basophils Absolute: 0 10*3/uL (ref 0.0–0.1)
Basophils Relative: 1 %
EOS PCT: 2 %
Eosinophils Absolute: 0.1 10*3/uL (ref 0.0–0.5)
HCT: 36.7 % (ref 34.8–46.6)
Hemoglobin: 12.1 g/dL (ref 11.6–15.9)
LYMPHS ABS: 1.9 10*3/uL (ref 0.9–3.3)
LYMPHS PCT: 33 %
MCH: 30.7 pg (ref 25.1–34.0)
MCHC: 32.9 g/dL (ref 31.5–36.0)
MCV: 93.2 fL (ref 79.5–101.0)
MONO ABS: 0.5 10*3/uL (ref 0.1–0.9)
MONOS PCT: 8 %
Neutro Abs: 3.2 10*3/uL (ref 1.5–6.5)
Neutrophils Relative %: 56 %
PLATELETS: 310 10*3/uL (ref 145–400)
RBC: 3.94 MIL/uL (ref 3.70–5.45)
RDW: 14.3 % (ref 11.2–14.5)
WBC: 5.7 10*3/uL (ref 3.9–10.3)

## 2018-04-06 NOTE — Assessment & Plan Note (Signed)
She has no signs or symptoms of cancer recurrence I educated the patient signs and symptoms to watch out for She is almost 5 years out from her treatment I recommend discontinuation of long-term follow-up here

## 2018-04-06 NOTE — Assessment & Plan Note (Signed)
She is a long-term cancer survivor She will continue annual screening mammogram on the contralateral breast

## 2018-04-06 NOTE — Progress Notes (Signed)
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 [facility]) She has no signs or symptoms of cancer recurrence I educated the patient signs and symptoms to watch out for She is almost 5 years out from her treatment I recommend discontinuation of long-term follow-up here   History of right breast cancer She is a long-term cancer survivor She will continue annual screening mammogram on the contralateral breast  History of gastrointestinal stromal tumor (GIST) She has no signs or symptoms to suggest recurrence of gastrointestinal stromal tumor I recommend repeat endoscopic evaluation in 4 years with her gastroenterologist There is no benefit for surveillance routine imaging study   No orders of the defined types were placed in this encounter.   INTERVAL HISTORY: Please see below for problem oriented charting. 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 Staging scans showed adenopathy in the left internal jugular chain, mediastinum, peritoneum, moderate pericardial effusion along with cardiomegaly      03/01/2013 - 08/19/2013 Chemotherapy    The patient had 6 cycles of ABVD in [place]      01/31/2014 Imaging    Outside CT scan of the chest, abdomen and pelvis showed reduction in size of  lymphadenopathy      03/03/2014 Imaging    Outside CT that showed no evidence of lymphadenopathy, 6 mm left thyroid nodule and postoperative partial resection of right lobe of thyroid      06/07/2014 PET scan    Outside PET scan showed no evidence of cancer recurrence      09/29/2014 Imaging    Outside CT scan of the neck, chest, abdomen and pelvis showed reduction in the size of mediastinal lymph nodes and stable sclerotic focus in L1 vertebral body      02/01/2015 PET scan    Outside PET scan show no evidence of disease      08/09/2015 PET scan    Outside PET scan showed no evidence of disease      11/16/2015 Imaging    Outside screening left breast mammogram showed asymmetric density in the upper inner left breast, spot compression view recommended ultrasound      12/04/2015 Imaging    Outside ultrasound of the left breast showed no masses.      01/31/2016 PET scan    Outside PET scan showed no evidence of cancer      10/04/2016 Imaging    CT scan showed no evidence for lymphadenopathy in the chest, abdomen, or pelvis. No findings to suggest soft tissue metastases in the chest, abdomen, or pelvis. 2. Postsurgical changes in the stomach, possibly related to the history of GI stromal tumor. 3. Despite reported surgical history of appendectomy, the normal appendix is clearly visualized on today's exam. 4. Scattered, well-defined, small sclerotic lesions in the thoracic and lumbar spine. These are likely benign and related to  bone islands, but without comparative imaging, sclerotic metastases are not entirely excluded. If warranted, nuclear medicine bone scan to confirm lack of active bony turnover at these sites may prove helpful. 5. Abdominal Aortic Atherosclerois (ICD10-170.0) 6. Status post hysterectomy.       REVIEW OF SYSTEMS:   Constitutional: Denies fevers, chills or abnormal weight loss Eyes: Denies blurriness of vision Ears, nose, mouth, throat, and face: Denies mucositis or  sore throat Respiratory: Denies cough, dyspnea or wheezes Cardiovascular: Denies palpitation, chest discomfort or lower extremity swelling Gastrointestinal:  Denies nausea, heartburn or change in bowel habits Skin: Denies abnormal skin rashes Lymphatics: Denies new lymphadenopathy or easy bruising Neurological:Denies numbness, tingling or new weaknesses Behavioral/Psych: Mood is stable, no new changes  All other systems were reviewed with the patient and are negative.  I have reviewed the past medical history, past surgical history, social history and family history with the patient and they are unchanged from previous note.  ALLERGIES:  is allergic to latex.  MEDICATIONS:  Current Outpatient Medications  Medication Sig Dispense Refill  . alendronate (FOSAMAX) 35 MG tablet Take 35 mg by mouth every Sunday. Take with a full glass of water on an empty stomach.     Marland Kitchen amLODipine (NORVASC) 10 MG tablet Take 10 mg by mouth daily.     . Calcium Carbonate-Vitamin D (CALCIUM-VITAMIN D3 PO) Take 1 tablet by mouth 2 (two) times daily.    . cholecalciferol (VITAMIN D) 1000 units tablet Take 1,000 Units by mouth daily.    Marland Kitchen levothyroxine (SYNTHROID, LEVOTHROID) 100 MCG tablet Take 100 mcg by mouth daily before breakfast.     . metoprolol (LOPRESSOR) 50 MG tablet Take 50 mg by mouth 2 (two) times daily.     . montelukast (SINGULAIR) 10 MG tablet Take 10 mg by mouth at bedtime.    . Multiple Vitamin (MULTIVITAMIN) tablet Take 2 tablets by mouth daily.     . Omega-3 Fatty Acids (OMEGA-3 PO) Take 1 capsule by mouth daily.     Marland Kitchen omeprazole (PRILOSEC) 40 MG capsule Take 1 capsule (40 mg total) by mouth daily. Take one capsule 20-30 minutes before your first meal of the day. 30 capsule 11  . Polyethyl Glycol-Propyl Glycol (SYSTANE OP) Place 2 drops into both eyes daily as needed (for dry eyes).     Current Facility-Administered Medications  Medication Dose Route Frequency Provider Last Rate Last Dose  .  0.9 %  sodium chloride infusion  500 mL Intravenous Continuous Milus Banister, MD        PHYSICAL EXAMINATION: ECOG PERFORMANCE STATUS: 0 - Asymptomatic  Vitals:   04/06/18 0948  BP: 136/79  Pulse: 71  Resp: 18  Temp: 98.2 F (36.8 C)  SpO2: 96%   Filed Weights   04/06/18 0948  Weight: 198 lb 14.4 oz (90.2 kg)    GENERAL:alert, no distress and comfortable SKIN: skin color, texture, turgor are normal, no rashes or significant lesions EYES: normal, Conjunctiva are pink and non-injected, sclera clear OROPHARYNX:no exudate, no erythema and lips, buccal mucosa, and tongue normal  NECK: supple, thyroid normal size, non-tender, without nodularity LYMPH:  no palpable lymphadenopathy in the cervical, axillary or inguinal LUNGS: clear to auscultation and percussion with normal breathing effort HEART: regular rate & rhythm and no murmurs and no lower extremity edema ABDOMEN:abdomen soft, non-tender and normal bowel sounds Musculoskeletal:no cyanosis of digits and no clubbing  NEURO: alert & oriented x 3 with fluent speech, no focal motor/sensory deficits She has well-healed  mastectomy scar on the right breast with implant in situ.  No palpable abnormalities on the left  LABORATORY DATA:  I have reviewed the data as listed    Component Value Date/Time   NA 143 04/06/2018 0910   NA 144 03/20/2017 1225   K 4.0 04/06/2018 0910   K 3.8 03/20/2017 1225   CL 105 04/06/2018 0910   CO2 31 04/06/2018 0910   CO2 28 03/20/2017 1225   GLUCOSE 85 04/06/2018 0910   GLUCOSE 98 03/20/2017 1225   BUN 11 04/06/2018 0910   BUN 15.5 03/20/2017 1225   CREATININE 0.74 04/06/2018 0910   CREATININE 1.0 03/20/2017 1225   CALCIUM 10.3 04/06/2018 0910   CALCIUM 10.0 03/20/2017 1225   PROT 7.8 04/06/2018 0910   PROT 7.9 03/20/2017 1225   ALBUMIN 4.1 04/06/2018 0910   ALBUMIN 4.2 03/20/2017 1225   AST 15 04/06/2018 0910   AST 19 03/20/2017 1225   ALT 10 04/06/2018 0910   ALT 14 03/20/2017 1225    ALKPHOS 111 04/06/2018 0910   ALKPHOS 110 03/20/2017 1225   BILITOT 0.6 04/06/2018 0910   BILITOT 0.88 03/20/2017 1225   GFRNONAA >60 04/06/2018 0910   GFRAA >60 04/06/2018 0910    No results found for: SPEP, UPEP  Lab Results  Component Value Date   WBC 5.7 04/06/2018   NEUTROABS 3.2 04/06/2018   HGB 12.1 04/06/2018   HCT 36.7 04/06/2018   MCV 93.2 04/06/2018   PLT 310 04/06/2018      Chemistry      Component Value Date/Time   NA 143 04/06/2018 0910   NA 144 03/20/2017 1225   K 4.0 04/06/2018 0910   K 3.8 03/20/2017 1225   CL 105 04/06/2018 0910   CO2 31 04/06/2018 0910   CO2 28 03/20/2017 1225   BUN 11 04/06/2018 0910   BUN 15.5 03/20/2017 1225   CREATININE 0.74 04/06/2018 0910   CREATININE 1.0 03/20/2017 1225      Component Value Date/Time   CALCIUM 10.3 04/06/2018 0910   CALCIUM 10.0 03/20/2017 1225   ALKPHOS 111 04/06/2018 0910   ALKPHOS 110 03/20/2017 1225   AST 15 04/06/2018 0910   AST 19 03/20/2017 1225   ALT 10 04/06/2018 0910   ALT 14 03/20/2017 1225   BILITOT 0.6 04/06/2018 0910   BILITOT 0.88 03/20/2017 1225      All questions were answered. The patient knows to call the clinic with any problems, questions or concerns. No barriers to learning was detected.  I spent 15 minutes counseling the patient face to face. The total time spent in the appointment was 20 minutes and more than 50% was on counseling and review of test results  Heath Lark, MD 04/06/2018 11:00 AM

## 2018-04-06 NOTE — Assessment & Plan Note (Signed)
She has no signs or symptoms to suggest recurrence of gastrointestinal stromal tumor I recommend repeat endoscopic evaluation in 4 years with her gastroenterologist There is no benefit for surveillance routine imaging study

## 2018-04-07 DIAGNOSIS — R262 Difficulty in walking, not elsewhere classified: Secondary | ICD-10-CM | POA: Diagnosis not present

## 2018-04-07 DIAGNOSIS — Z471 Aftercare following joint replacement surgery: Secondary | ICD-10-CM | POA: Diagnosis not present

## 2018-04-07 DIAGNOSIS — M25561 Pain in right knee: Secondary | ICD-10-CM | POA: Diagnosis not present

## 2018-04-07 DIAGNOSIS — M1711 Unilateral primary osteoarthritis, right knee: Secondary | ICD-10-CM | POA: Diagnosis not present

## 2018-04-09 DIAGNOSIS — Z471 Aftercare following joint replacement surgery: Secondary | ICD-10-CM | POA: Diagnosis not present

## 2018-04-09 DIAGNOSIS — M25561 Pain in right knee: Secondary | ICD-10-CM | POA: Diagnosis not present

## 2018-04-09 DIAGNOSIS — R262 Difficulty in walking, not elsewhere classified: Secondary | ICD-10-CM | POA: Diagnosis not present

## 2018-04-09 DIAGNOSIS — M1711 Unilateral primary osteoarthritis, right knee: Secondary | ICD-10-CM | POA: Diagnosis not present

## 2018-04-10 DIAGNOSIS — Z0000 Encounter for general adult medical examination without abnormal findings: Secondary | ICD-10-CM | POA: Diagnosis not present

## 2018-04-13 ENCOUNTER — Other Ambulatory Visit: Payer: Self-pay | Admitting: Gastroenterology

## 2018-04-13 DIAGNOSIS — K2100 Gastro-esophageal reflux disease with esophagitis, without bleeding: Secondary | ICD-10-CM

## 2018-04-13 DIAGNOSIS — Z8509 Personal history of malignant neoplasm of other digestive organs: Secondary | ICD-10-CM

## 2018-04-14 ENCOUNTER — Ambulatory Visit
Admission: RE | Admit: 2018-04-14 | Discharge: 2018-04-14 | Disposition: A | Discharge status: Home / Self Care | Payer: Medicare HMO | Source: Ambulatory Visit | Attending: Endocrinology | Admitting: Endocrinology

## 2018-04-14 DIAGNOSIS — F5101 Primary insomnia: Secondary | ICD-10-CM | POA: Diagnosis not present

## 2018-04-14 DIAGNOSIS — E041 Nontoxic single thyroid nodule: Secondary | ICD-10-CM

## 2018-04-14 DIAGNOSIS — M130 Polyarthritis, unspecified: Secondary | ICD-10-CM | POA: Diagnosis not present

## 2018-04-16 DIAGNOSIS — Z471 Aftercare following joint replacement surgery: Secondary | ICD-10-CM | POA: Diagnosis not present

## 2018-04-16 DIAGNOSIS — R262 Difficulty in walking, not elsewhere classified: Secondary | ICD-10-CM | POA: Diagnosis not present

## 2018-04-16 DIAGNOSIS — M1711 Unilateral primary osteoarthritis, right knee: Secondary | ICD-10-CM | POA: Diagnosis not present

## 2018-04-16 DIAGNOSIS — M25561 Pain in right knee: Secondary | ICD-10-CM | POA: Diagnosis not present

## 2018-08-10 DIAGNOSIS — Z6833 Body mass index (BMI) 33.0-33.9, adult: Secondary | ICD-10-CM | POA: Diagnosis not present

## 2018-08-10 DIAGNOSIS — E039 Hypothyroidism, unspecified: Secondary | ICD-10-CM | POA: Diagnosis not present

## 2018-08-10 DIAGNOSIS — I10 Essential (primary) hypertension: Secondary | ICD-10-CM | POA: Diagnosis not present

## 2018-08-26 DIAGNOSIS — K2970 Gastritis, unspecified, without bleeding: Secondary | ICD-10-CM | POA: Diagnosis not present

## 2018-11-02 IMAGING — DX DG KNEE 1-2V PORT*R*
2 series · 2 of 2 positions shown · non-contrast
Comparison: None.

CLINICAL DATA: 67-year-old female post right knee replacement.
Initial encounter.

EXAM:
PORTABLE RIGHT KNEE - 1-2 VIEW

[knee ap]
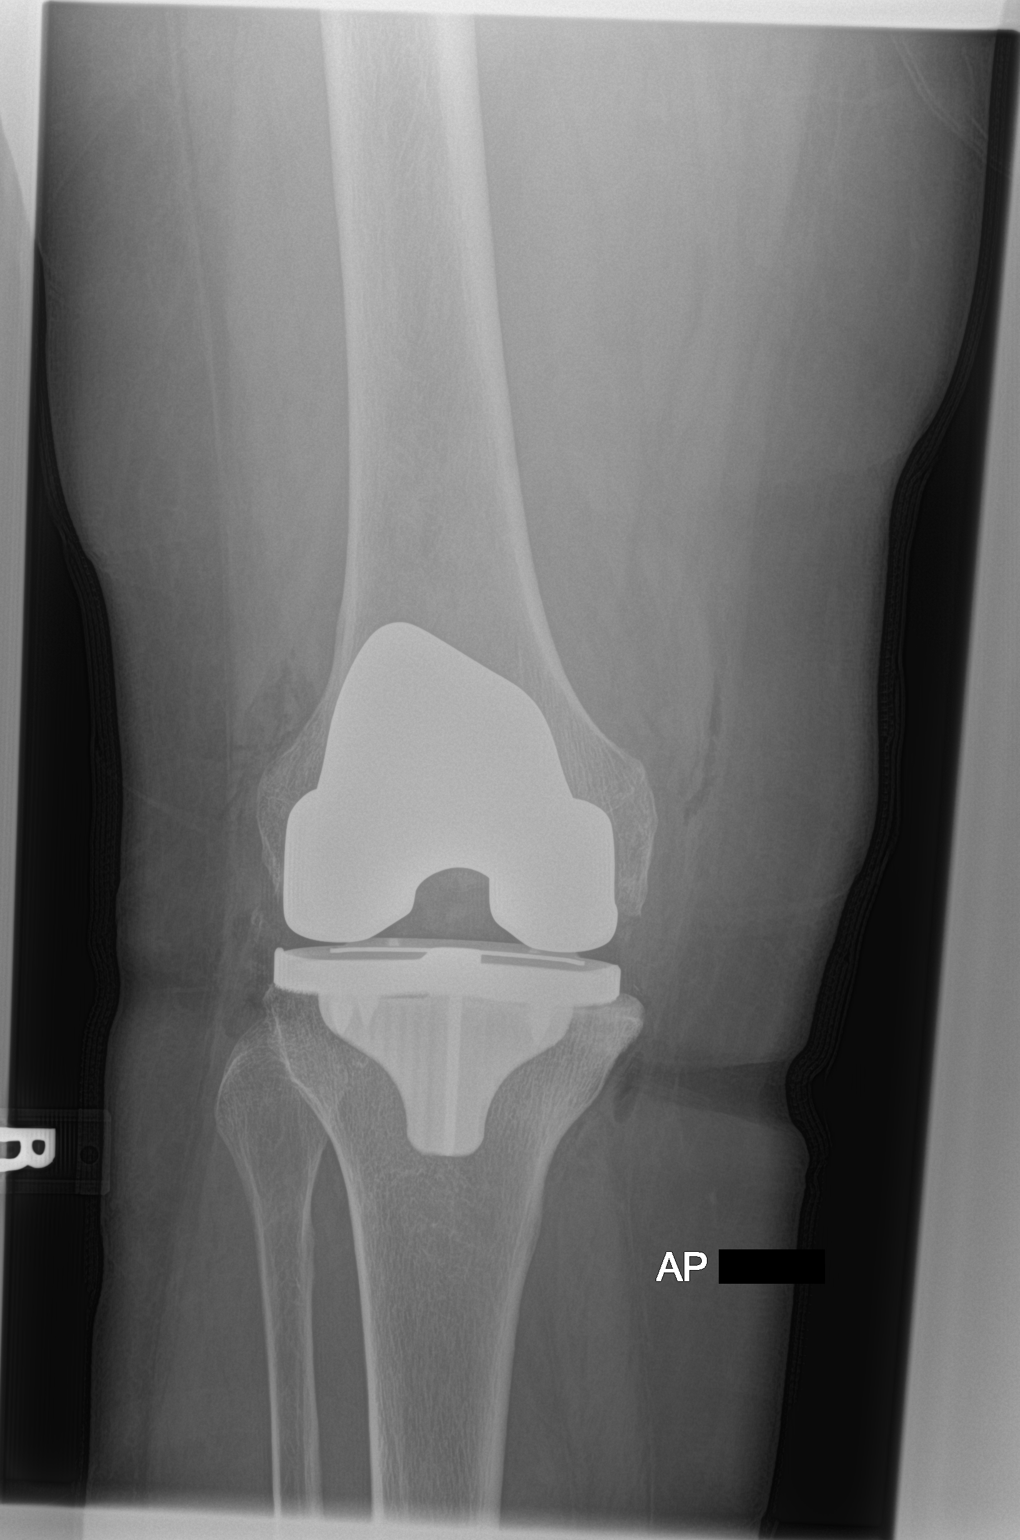

[knee lat]
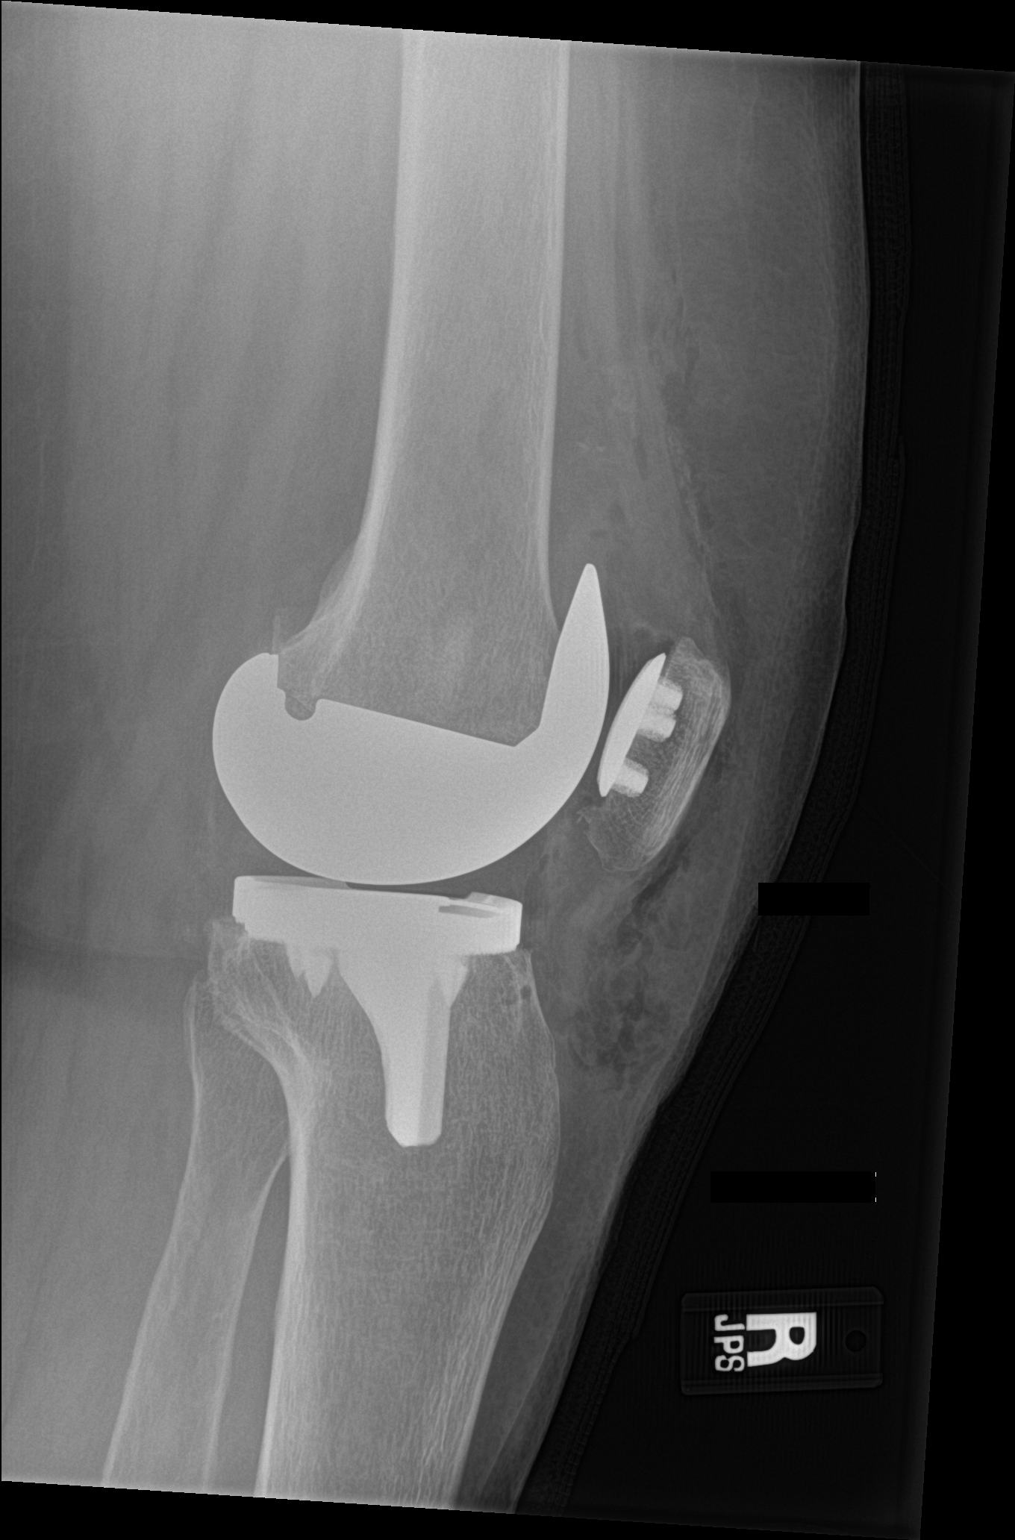

[2 of 2 positions shown; findings below may reference images not displayed]

FINDINGS: Post total right knee replacement which appears in satisfactory
position without complication noted.
IMPRESSION: Post total right knee replacement.

## 2018-12-03 IMAGING — US US THYROID
1 series · 13 of 25 positions shown · non-contrast
Comparison: None.

CLINICAL DATA: Other. History of right thyroidectomy for thyroid
carcinoma.

EXAM:
THYROID ULTRASOUND
TECHNIQUE: Ultrasound examination of the thyroid gland and adjacent soft
tissues was performed.

[Series 1: us thyroid · 0.06mm/px · 13 of 54 slices shown]
[im 1/54]
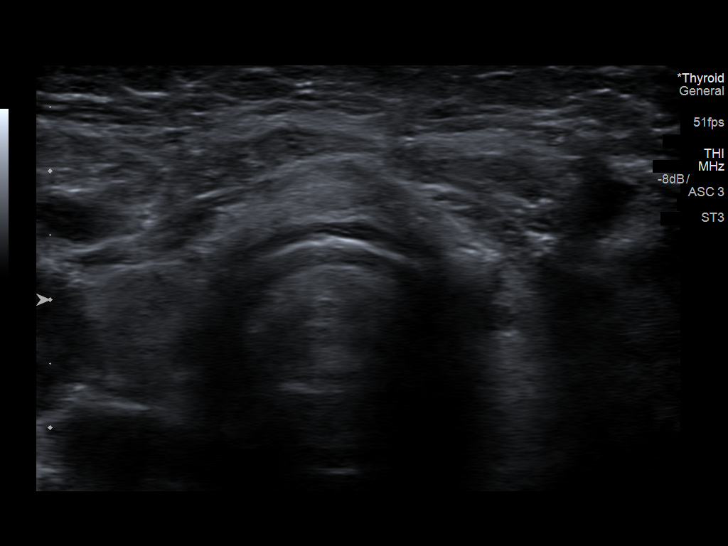
[im 5/54]
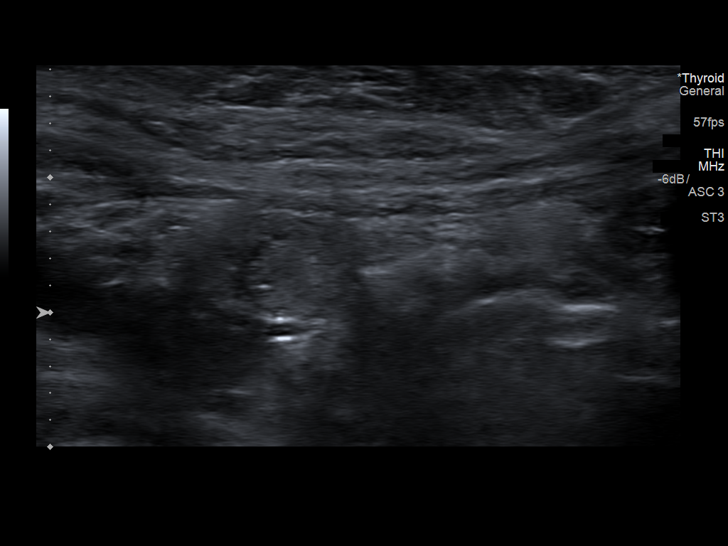
[im 9/54]
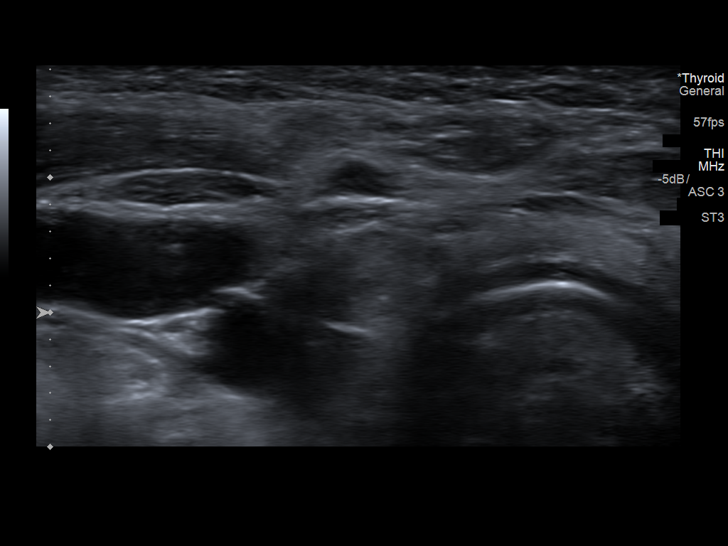
[im 14/54]
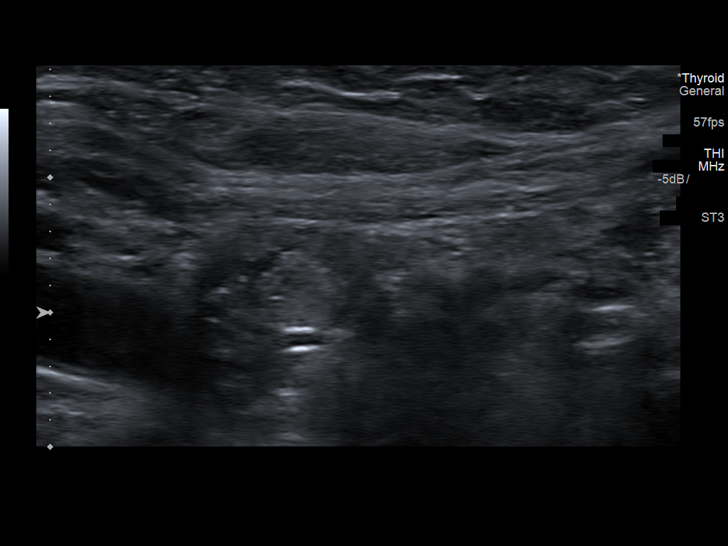
[im 18/54]
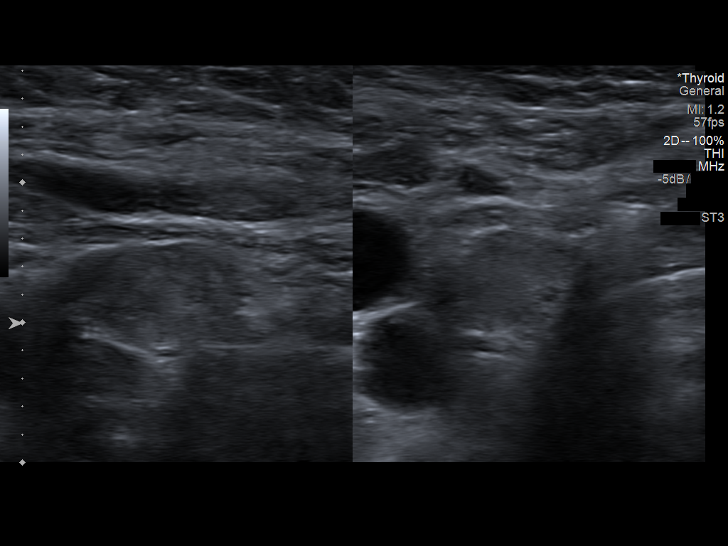
[im 23/54]
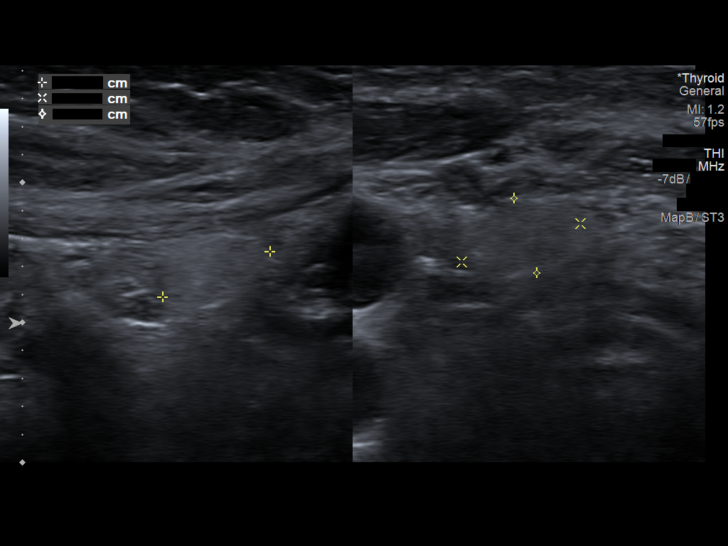
[im 27/54]
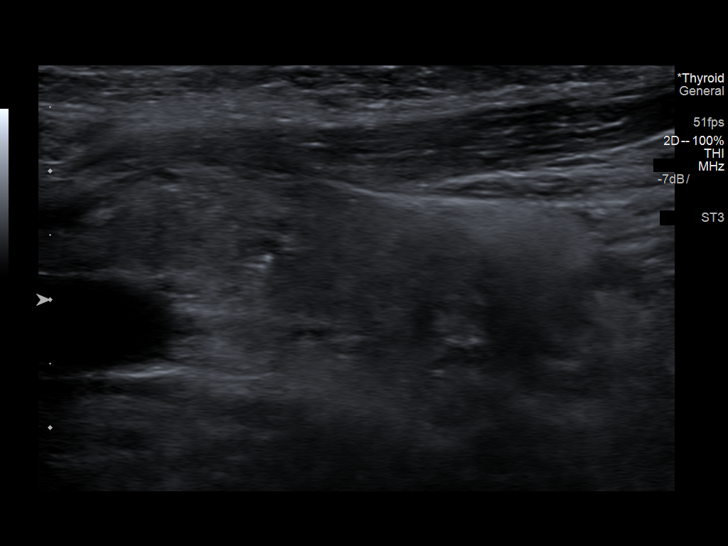
[im 31/54]
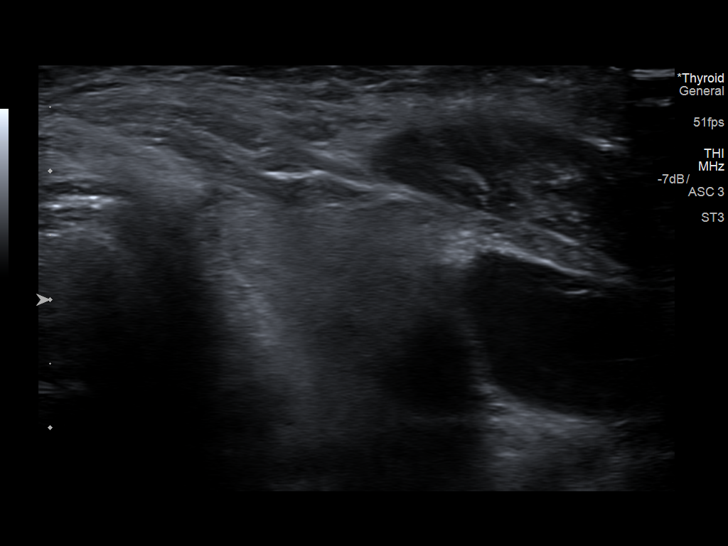
[im 36/54]
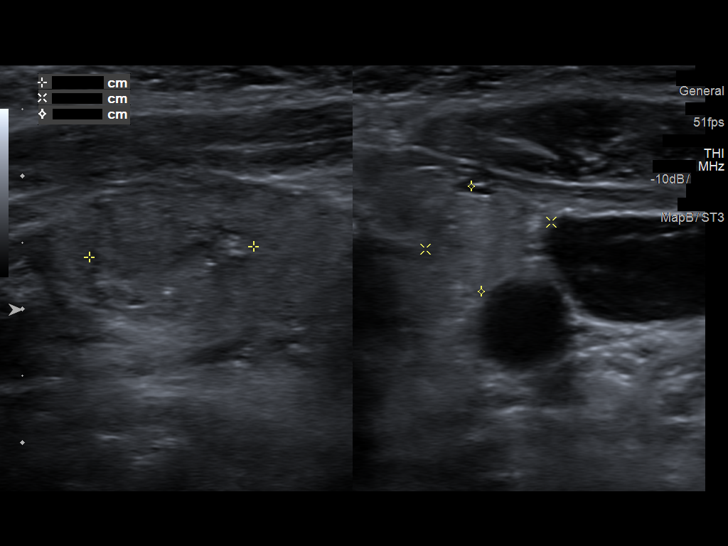
[im 40/54]
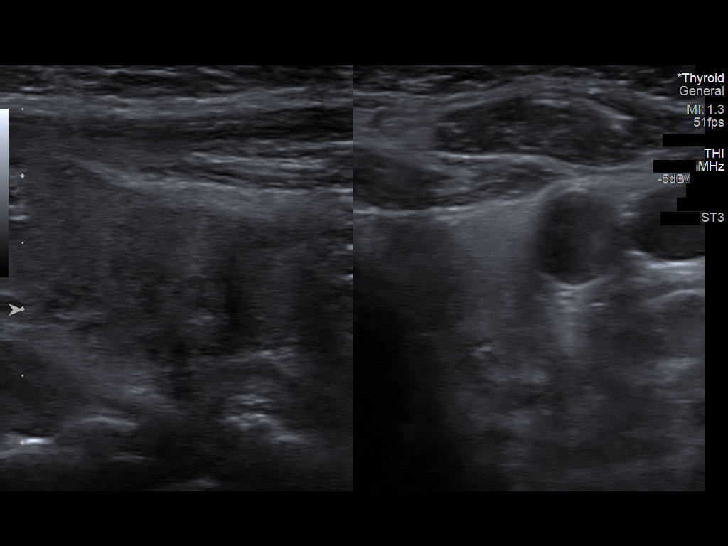
[im 45/54]
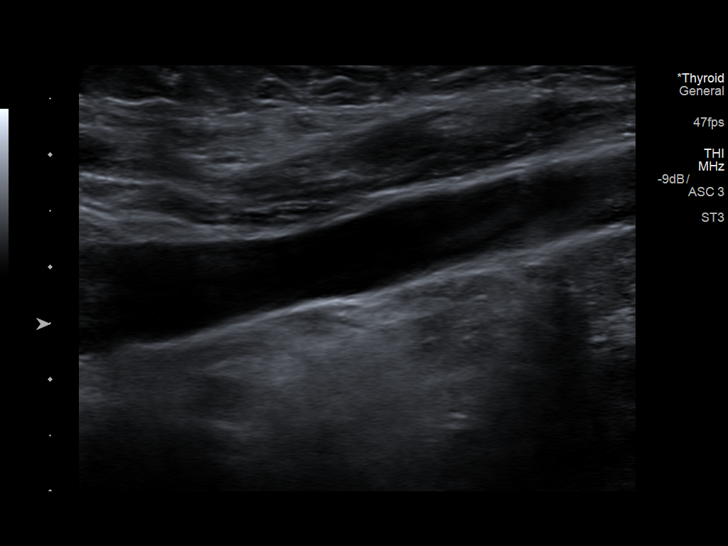
[im 49/54]
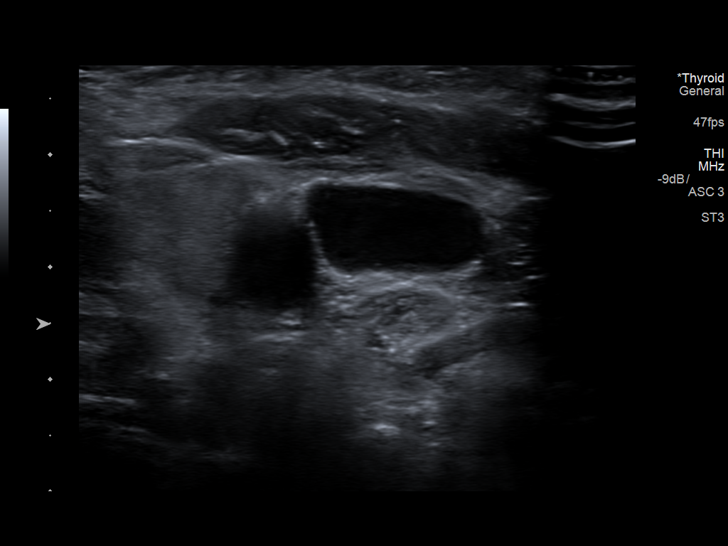
[im 54/54]
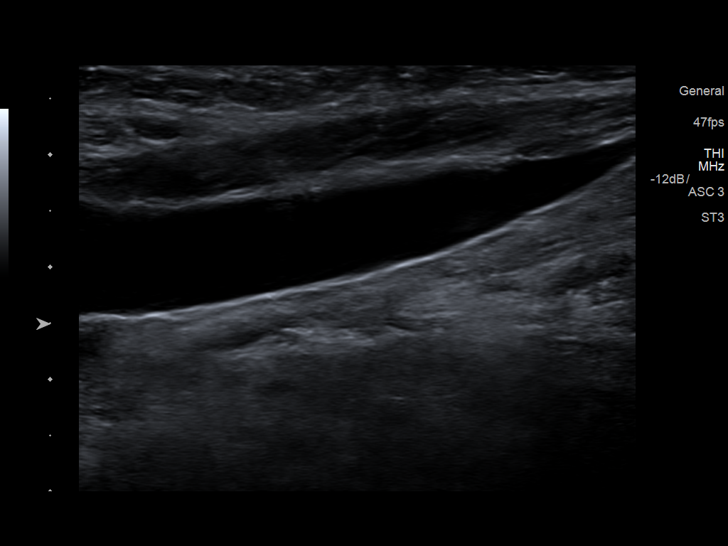

[13 of 25 positions shown; findings below may reference images not displayed]

FINDINGS: Parenchymal Echotexture: Moderately heterogenous

Isthmus: 0.3 cm

Right lobe: See below.

Left lobe: 4.2 x 1.7 x 1.4 cm

_________________________________________________________

Estimated total number of nodules >/= 1 cm: 1

Number of spongiform nodules >/=  2 cm not described below (TR1): 0

Number of mixed cystic and solid nodules >/= 1.5 cm not described
below (TR2): 0

_________________________________________________________

There is vague tissue in the right neck measuring roughly 1.4 x
x 1.1 cm this does appear to demonstrate some blood flow and may
represent residual thyroid tissue. Correlation suggested with any
prior postoperative ultrasound studies in determining whether there
was any previous demonstration of residual thyroid tissue in the
right neck.

Nodule # 1:

Location: Left; Superior

Maximum size: 1.2 cm; Other 2 dimensions: 1.0 x 0.8 cm

Composition: solid/almost completely solid (2)

Echogenicity: isoechoic (1)

Shape: not taller-than-wide (0)

Margins: ill-defined (0)

Echogenic foci: none (0)

ACR TI-RADS total points: 3.

ACR TI-RADS risk category: TR3 (3 points).

ACR TI-RADS recommendations:

Given size (<1.4 cm) and appearance, this nodule does NOT meet
TI-RADS criteria for biopsy or dedicated follow-up.

_________________________________________________________

Nodule # 2:

Location: Left; Inferior

Maximum size: 0.8 cm; Other 2 dimensions: 0.7 x 0.8 cm

Composition: solid/almost completely solid (2)

Echogenicity: isoechoic (1)

Shape: not taller-than-wide (0)

Margins: ill-defined (0)

Echogenic foci: none (0)

ACR TI-RADS total points: 3.

ACR TI-RADS risk category: TR3 (3 points).

ACR TI-RADS recommendations:

Given size (<1.4 cm) and appearance, this nodule does NOT meet
TI-RADS criteria for biopsy or dedicated follow-up.

_________________________________________________________

No abnormal lymph nodes identified.
IMPRESSION: 1. Vague soft tissue in the right neck at the level of the right
thyroid bed measures up to 1.4 cm in estimated diameter. This does
appear to contain blood flow and may represent residual thyroid
tissue. Correlation suggested with prior ultrasound studies after
thyroidectomy in determining whether any tissue was previously seen
in this region postoperatively.
2. Two separate small nodules in the left lobe do not meet criteria
for biopsy or further follow-up.

The above is in keeping with the ACR TI-RADS recommendations - [HOSPITAL] [id number];[DATE].

## 2018-12-04 DIAGNOSIS — H04123 Dry eye syndrome of bilateral lacrimal glands: Secondary | ICD-10-CM | POA: Diagnosis not present

## 2018-12-04 DIAGNOSIS — H2513 Age-related nuclear cataract, bilateral: Secondary | ICD-10-CM | POA: Diagnosis not present

## 2018-12-04 DIAGNOSIS — H0102A Squamous blepharitis right eye, upper and lower eyelids: Secondary | ICD-10-CM | POA: Diagnosis not present

## 2018-12-04 DIAGNOSIS — H0102B Squamous blepharitis left eye, upper and lower eyelids: Secondary | ICD-10-CM | POA: Diagnosis not present

## 2018-12-04 DIAGNOSIS — H40013 Open angle with borderline findings, low risk, bilateral: Secondary | ICD-10-CM | POA: Diagnosis not present

## 2018-12-04 DIAGNOSIS — H10413 Chronic giant papillary conjunctivitis, bilateral: Secondary | ICD-10-CM | POA: Diagnosis not present

## 2018-12-05 DIAGNOSIS — Z853 Personal history of malignant neoplasm of breast: Secondary | ICD-10-CM | POA: Diagnosis not present

## 2018-12-05 DIAGNOSIS — Z1231 Encounter for screening mammogram for malignant neoplasm of breast: Secondary | ICD-10-CM | POA: Diagnosis not present

## 2018-12-08 DIAGNOSIS — M130 Polyarthritis, unspecified: Secondary | ICD-10-CM | POA: Diagnosis not present

## 2018-12-08 DIAGNOSIS — E039 Hypothyroidism, unspecified: Secondary | ICD-10-CM | POA: Diagnosis not present

## 2018-12-08 DIAGNOSIS — I10 Essential (primary) hypertension: Secondary | ICD-10-CM | POA: Diagnosis not present

## 2018-12-08 DIAGNOSIS — C50019 Malignant neoplasm of nipple and areola, unspecified female breast: Secondary | ICD-10-CM | POA: Diagnosis not present

## 2019-03-03 DIAGNOSIS — K2970 Gastritis, unspecified, without bleeding: Secondary | ICD-10-CM | POA: Diagnosis not present

## 2019-03-03 DIAGNOSIS — F5101 Primary insomnia: Secondary | ICD-10-CM | POA: Diagnosis not present

## 2019-03-03 DIAGNOSIS — C50019 Malignant neoplasm of nipple and areola, unspecified female breast: Secondary | ICD-10-CM | POA: Diagnosis not present

## 2019-03-03 DIAGNOSIS — M130 Polyarthritis, unspecified: Secondary | ICD-10-CM | POA: Diagnosis not present

## 2019-03-03 DIAGNOSIS — M1711 Unilateral primary osteoarthritis, right knee: Secondary | ICD-10-CM | POA: Diagnosis not present

## 2019-03-03 DIAGNOSIS — E039 Hypothyroidism, unspecified: Secondary | ICD-10-CM | POA: Diagnosis not present

## 2019-03-03 DIAGNOSIS — I10 Essential (primary) hypertension: Secondary | ICD-10-CM | POA: Diagnosis not present

## 2019-03-11 ENCOUNTER — Ambulatory Visit
Admission: RE | Admit: 2019-03-11 | Discharge: 2019-03-11 | Disposition: A | Discharge status: Home / Self Care | Payer: Medicare HMO | Source: Ambulatory Visit | Attending: Family Medicine | Admitting: Family Medicine

## 2019-03-11 ENCOUNTER — Other Ambulatory Visit: Payer: Self-pay | Admitting: Family Medicine

## 2019-03-11 DIAGNOSIS — I10 Essential (primary) hypertension: Secondary | ICD-10-CM | POA: Diagnosis not present

## 2019-03-11 DIAGNOSIS — R0989 Other specified symptoms and signs involving the circulatory and respiratory systems: Secondary | ICD-10-CM

## 2019-03-11 DIAGNOSIS — M130 Polyarthritis, unspecified: Secondary | ICD-10-CM | POA: Diagnosis not present

## 2019-03-11 DIAGNOSIS — R0602 Shortness of breath: Secondary | ICD-10-CM | POA: Diagnosis not present

## 2019-03-11 DIAGNOSIS — K219 Gastro-esophageal reflux disease without esophagitis: Secondary | ICD-10-CM | POA: Diagnosis not present

## 2019-03-11 DIAGNOSIS — M1310 Monoarthritis, not elsewhere classified, unspecified site: Secondary | ICD-10-CM | POA: Diagnosis not present

## 2019-03-18 DIAGNOSIS — M818 Other osteoporosis without current pathological fracture: Secondary | ICD-10-CM | POA: Diagnosis not present

## 2019-03-18 DIAGNOSIS — M1310 Monoarthritis, not elsewhere classified, unspecified site: Secondary | ICD-10-CM | POA: Diagnosis not present

## 2019-03-18 DIAGNOSIS — I10 Essential (primary) hypertension: Secondary | ICD-10-CM | POA: Diagnosis not present

## 2019-03-18 DIAGNOSIS — M130 Polyarthritis, unspecified: Secondary | ICD-10-CM | POA: Diagnosis not present

## 2019-04-01 DIAGNOSIS — E039 Hypothyroidism, unspecified: Secondary | ICD-10-CM | POA: Diagnosis not present

## 2019-04-12 DIAGNOSIS — E890 Postprocedural hypothyroidism: Secondary | ICD-10-CM | POA: Diagnosis not present

## 2019-04-12 DIAGNOSIS — E041 Nontoxic single thyroid nodule: Secondary | ICD-10-CM | POA: Diagnosis not present

## 2019-04-12 DIAGNOSIS — I10 Essential (primary) hypertension: Secondary | ICD-10-CM | POA: Diagnosis not present

## 2019-04-12 DIAGNOSIS — E039 Hypothyroidism, unspecified: Secondary | ICD-10-CM | POA: Diagnosis not present

## 2019-04-16 ENCOUNTER — Other Ambulatory Visit: Payer: Self-pay | Admitting: Endocrinology

## 2019-04-16 DIAGNOSIS — E041 Nontoxic single thyroid nodule: Secondary | ICD-10-CM

## 2019-04-21 DIAGNOSIS — E039 Hypothyroidism, unspecified: Secondary | ICD-10-CM | POA: Diagnosis not present

## 2019-04-21 DIAGNOSIS — I10 Essential (primary) hypertension: Secondary | ICD-10-CM | POA: Diagnosis not present

## 2019-04-21 DIAGNOSIS — M130 Polyarthritis, unspecified: Secondary | ICD-10-CM | POA: Diagnosis not present

## 2019-04-28 ENCOUNTER — Ambulatory Visit
Admission: RE | Admit: 2019-04-28 | Discharge: 2019-04-28 | Disposition: A | Discharge status: Home / Self Care | Payer: Medicare HMO | Source: Ambulatory Visit | Attending: Endocrinology | Admitting: Endocrinology

## 2019-04-28 DIAGNOSIS — E041 Nontoxic single thyroid nodule: Secondary | ICD-10-CM | POA: Diagnosis not present

## 2019-05-13 ENCOUNTER — Other Ambulatory Visit: Payer: Self-pay | Admitting: Gastroenterology

## 2019-05-13 DIAGNOSIS — K2100 Gastro-esophageal reflux disease with esophagitis, without bleeding: Secondary | ICD-10-CM

## 2019-05-13 DIAGNOSIS — Z8509 Personal history of malignant neoplasm of other digestive organs: Secondary | ICD-10-CM

## 2019-06-14 ENCOUNTER — Other Ambulatory Visit: Payer: Self-pay | Admitting: Gastroenterology

## 2019-06-14 DIAGNOSIS — Z8509 Personal history of malignant neoplasm of other digestive organs: Secondary | ICD-10-CM

## 2019-06-14 DIAGNOSIS — K2100 Gastro-esophageal reflux disease with esophagitis, without bleeding: Secondary | ICD-10-CM

## 2019-07-08 DIAGNOSIS — I10 Essential (primary) hypertension: Secondary | ICD-10-CM | POA: Diagnosis not present

## 2019-07-08 DIAGNOSIS — T7840XD Allergy, unspecified, subsequent encounter: Secondary | ICD-10-CM | POA: Diagnosis not present

## 2019-07-08 DIAGNOSIS — E039 Hypothyroidism, unspecified: Secondary | ICD-10-CM | POA: Diagnosis not present

## 2019-07-08 DIAGNOSIS — K2100 Gastro-esophageal reflux disease with esophagitis, without bleeding: Secondary | ICD-10-CM | POA: Diagnosis not present

## 2019-07-08 DIAGNOSIS — M810 Age-related osteoporosis without current pathological fracture: Secondary | ICD-10-CM | POA: Diagnosis not present

## 2019-07-08 DIAGNOSIS — Z1211 Encounter for screening for malignant neoplasm of colon: Secondary | ICD-10-CM | POA: Diagnosis not present

## 2019-07-16 DIAGNOSIS — Z96651 Presence of right artificial knee joint: Secondary | ICD-10-CM | POA: Diagnosis not present

## 2019-07-16 DIAGNOSIS — R29890 Loss of height: Secondary | ICD-10-CM | POA: Diagnosis not present

## 2019-07-16 DIAGNOSIS — Z853 Personal history of malignant neoplasm of breast: Secondary | ICD-10-CM | POA: Diagnosis not present

## 2019-07-16 DIAGNOSIS — Z90710 Acquired absence of both cervix and uterus: Secondary | ICD-10-CM | POA: Diagnosis not present

## 2019-07-16 DIAGNOSIS — M8589 Other specified disorders of bone density and structure, multiple sites: Secondary | ICD-10-CM | POA: Diagnosis not present

## 2019-07-20 DIAGNOSIS — Z1211 Encounter for screening for malignant neoplasm of colon: Secondary | ICD-10-CM | POA: Diagnosis not present

## 2019-09-03 DIAGNOSIS — M810 Age-related osteoporosis without current pathological fracture: Secondary | ICD-10-CM | POA: Diagnosis not present

## 2019-09-03 DIAGNOSIS — J301 Allergic rhinitis due to pollen: Secondary | ICD-10-CM | POA: Diagnosis not present

## 2019-09-03 DIAGNOSIS — E039 Hypothyroidism, unspecified: Secondary | ICD-10-CM | POA: Diagnosis not present

## 2019-09-03 DIAGNOSIS — I10 Essential (primary) hypertension: Secondary | ICD-10-CM | POA: Diagnosis not present

## 2019-09-03 DIAGNOSIS — Z0000 Encounter for general adult medical examination without abnormal findings: Secondary | ICD-10-CM | POA: Diagnosis not present

## 2019-11-24 DIAGNOSIS — Z03818 Encounter for observation for suspected exposure to other biological agents ruled out: Secondary | ICD-10-CM | POA: Diagnosis not present

## 2019-11-24 DIAGNOSIS — J40 Bronchitis, not specified as acute or chronic: Secondary | ICD-10-CM | POA: Diagnosis not present

## 2019-12-03 IMAGING — US US THYROID
1 series · 13 of 25 positions shown · non-contrast
Comparison: 04/14/2017

CLINICAL DATA: Prior ultrasound follow-up. History of partial right
thyroid lobectomy.

EXAM:
THYROID ULTRASOUND
TECHNIQUE: Ultrasound examination of the thyroid gland and adjacent soft
tissues was performed.

[Series 1: us thyroid · 0.06mm/px · 13 of 39 slices shown]
[im 1/39]
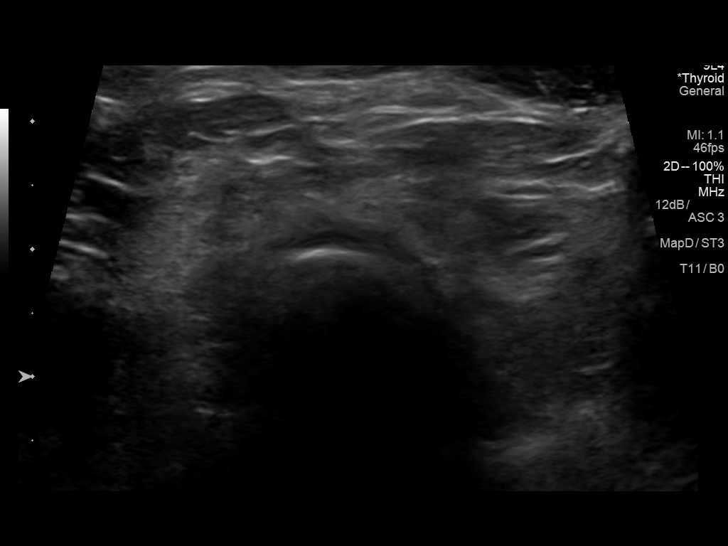
[im 4/39]
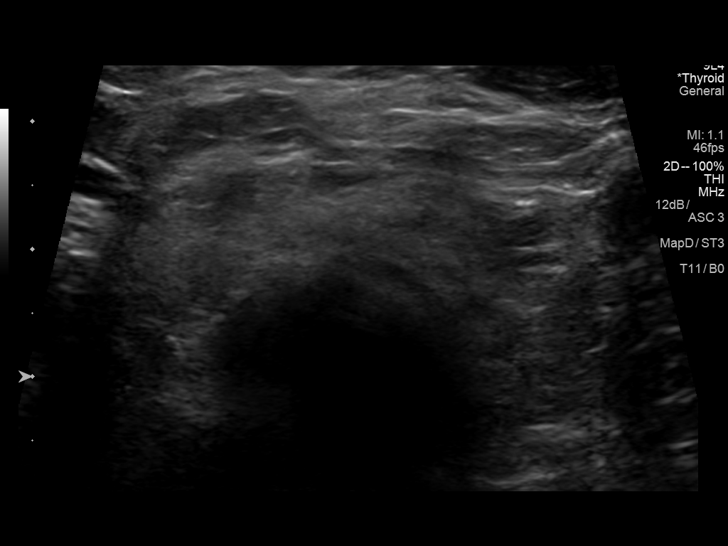
[im 7/39]
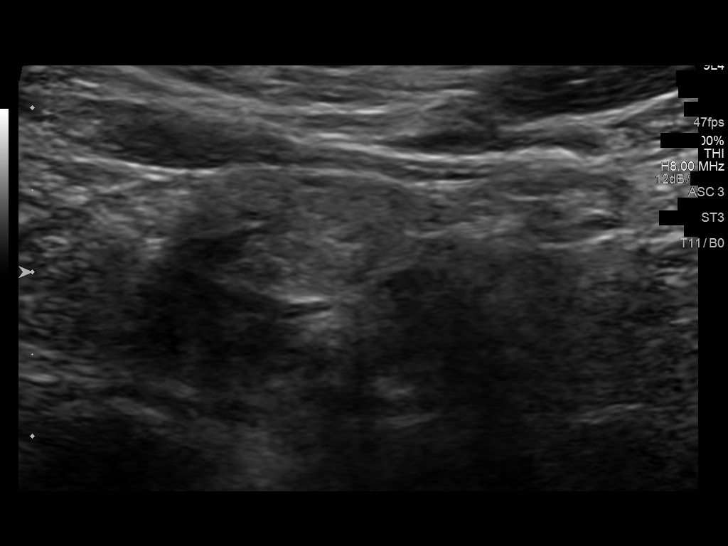
[im 10/39]
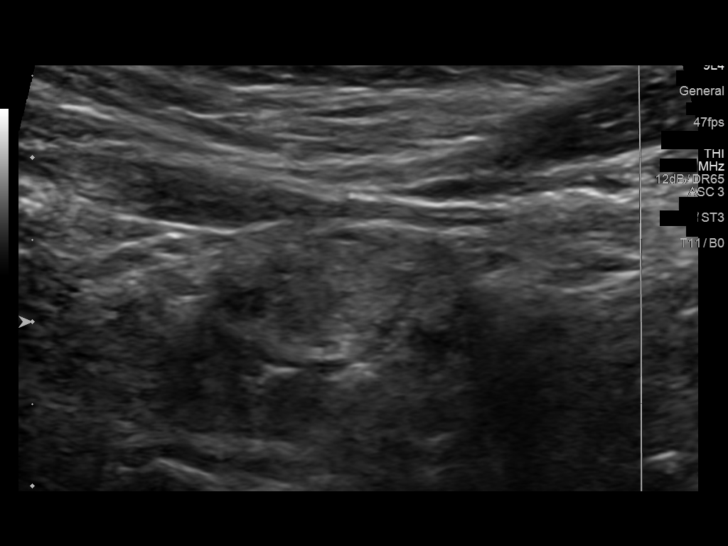
[im 13/39]
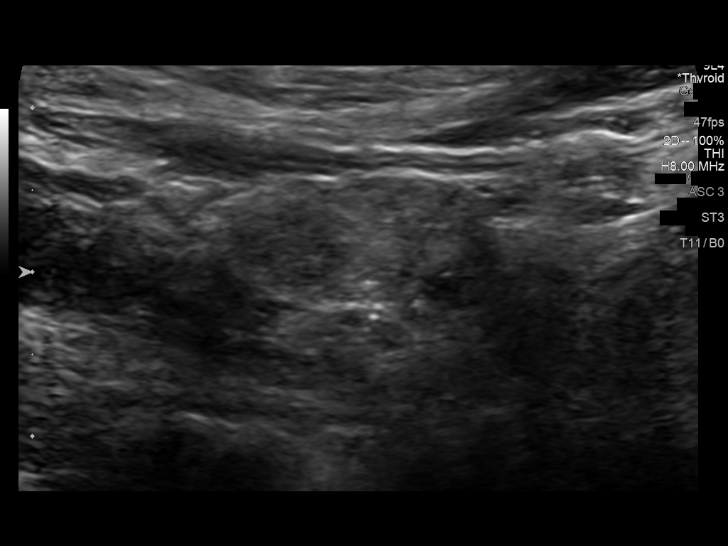
[im 16/39]
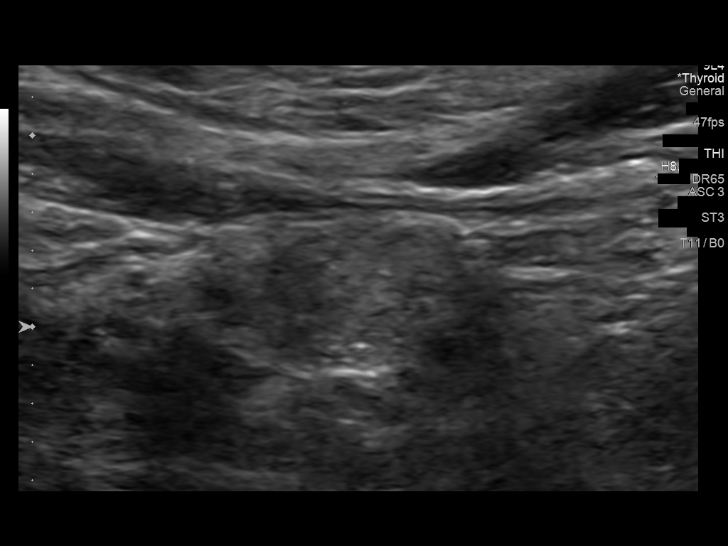
[im 20/39]
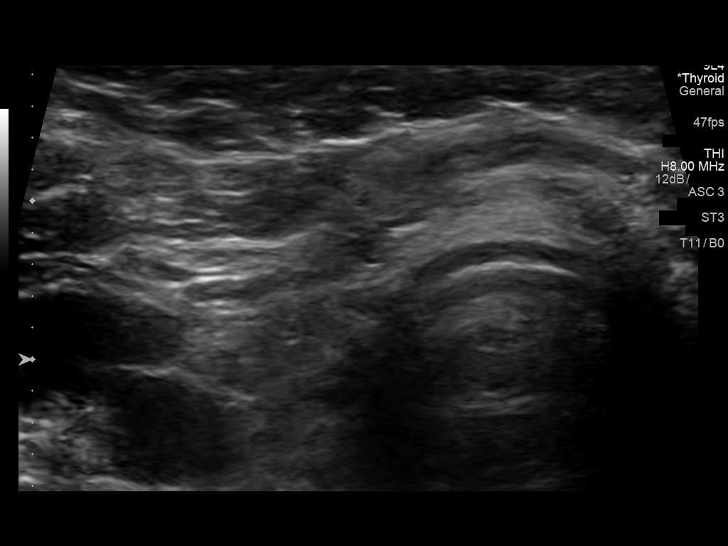
[im 23/39]
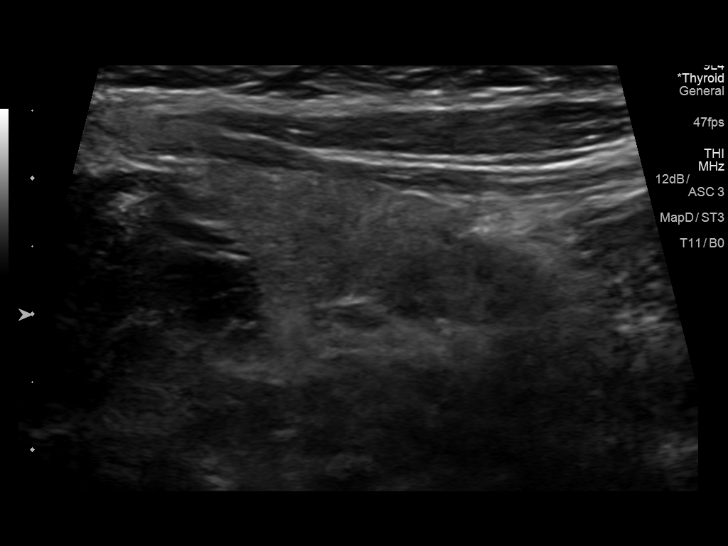
[im 26/39]
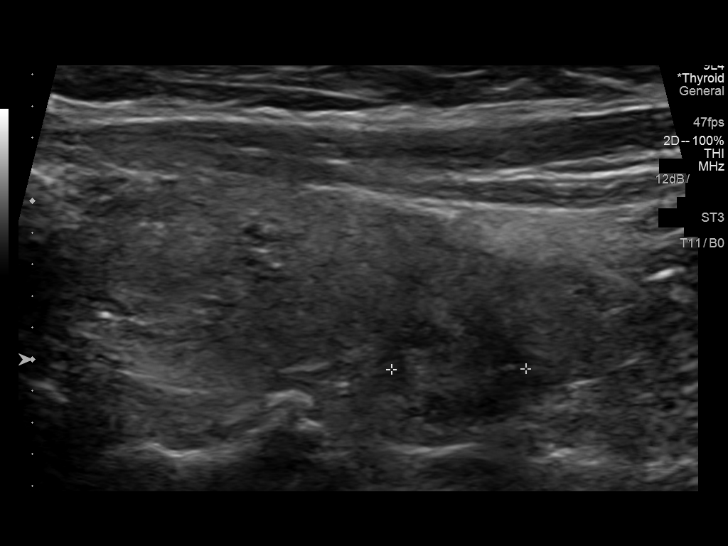
[im 29/39]
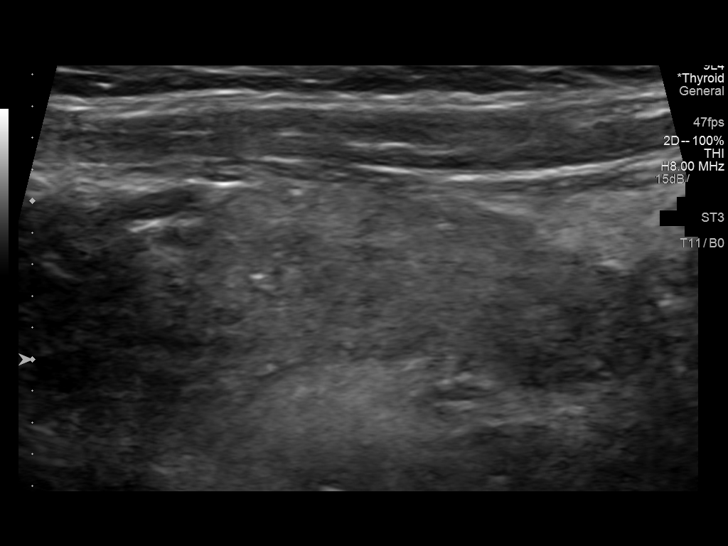
[im 32/39]
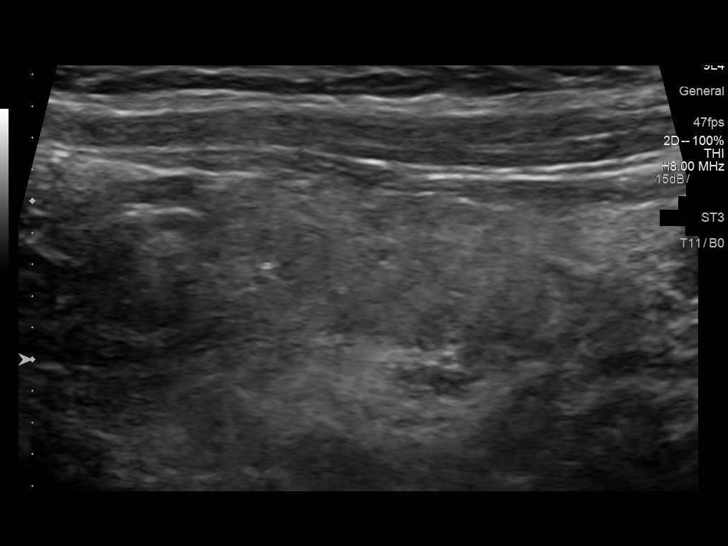
[im 35/39]
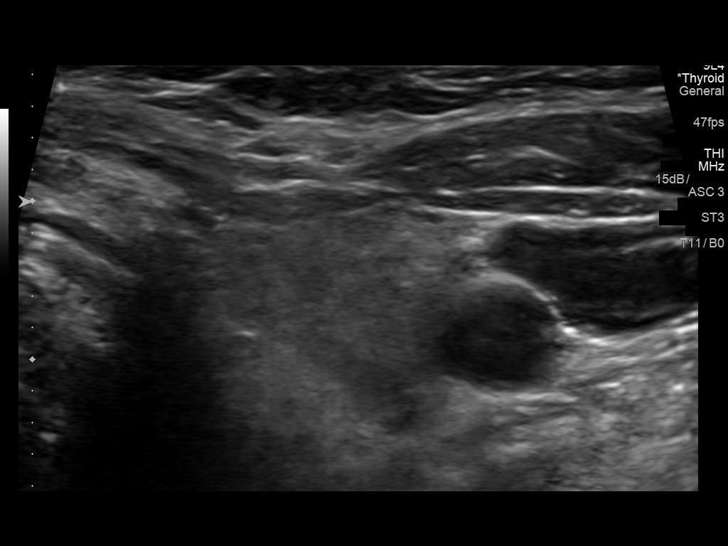
[im 39/39]
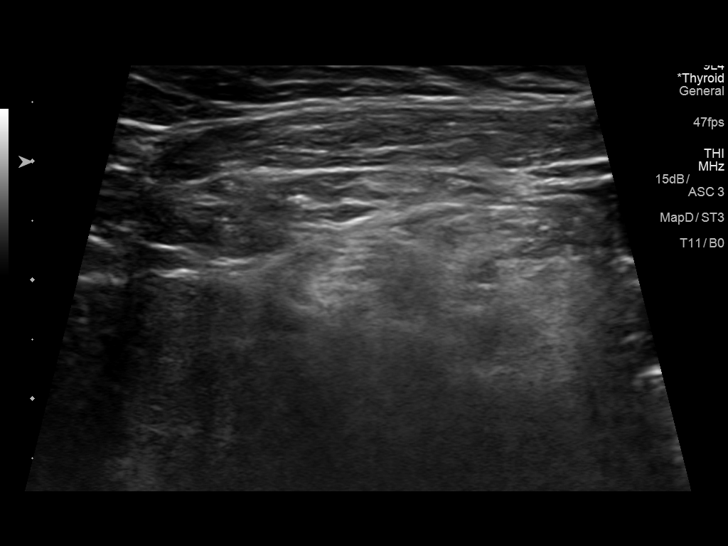

[13 of 25 positions shown; findings below may reference images not displayed]

FINDINGS: Parenchymal Echotexture: Mildly heterogenous

Isthmus: Normal in size measures 0.4 cm in diameter, unchanged

Right lobe: Diminutive in size compatible provided operative history
measuring approximately 1.7 x 0.8 x 1.0 cm, unchanged, previously,
1.5 x 0.8 x 1.3 cm

Left lobe: Normal in size measuring 4.1 x 1.4 x 1.6 cm, unchanged
previously, 4.2 x 1.7 x 1.4 cm

_________________________________________________________

Estimated total number of nodules >/= 1 cm: 1

Number of spongiform nodules >/=  2 cm not described below (TR1): 0

Number of mixed cystic and solid nodules >/= 1.5 cm not described
below (TR2): 0

_________________________________________________________

The approximately 1.0 x 0.9 x 0.7 cm isoechoic nodule/pseudo nodule
within the superior pole the right lobe of the thyroid (labeled 1)
is unchanged compared to the [DATE] examination, previously, 1.2 x
1.0 x 0.8 cm and again does not meet imaging criteria to recommend
percutaneous sampling or continued dedicated follow-up.

The approximately 0.9 x 0.7 x 0.7 cm hypoechoic nodule within the
posterior inferior aspect the right lobe of the thyroid (labeled 2)
is unchanged compared to the [DATE] examination, previously, 0.9 x
0.8 x 0.7 cm and again does not meet imaging criteria to recommend
percutaneous sampling or continued dedicated follow-up
IMPRESSION: 1. Unchanged appearance of presumed residual thyroid parenchyma
within the partial right thyroid lobectomy resection bed.
2. No new or enlarging thyroid nodules.
3. Left-sided thyroid nodules are unchanged compared to the [DATE]
examination and again do not meet imaging criteria to recommend
percutaneous sampling or continued dedicated follow-up.

The above is in keeping with the ACR TI-RADS recommendations - [HOSPITAL] [id number];[DATE].

## 2019-12-08 DIAGNOSIS — H40013 Open angle with borderline findings, low risk, bilateral: Secondary | ICD-10-CM | POA: Diagnosis not present

## 2019-12-08 DIAGNOSIS — H2513 Age-related nuclear cataract, bilateral: Secondary | ICD-10-CM | POA: Diagnosis not present

## 2019-12-08 DIAGNOSIS — H04123 Dry eye syndrome of bilateral lacrimal glands: Secondary | ICD-10-CM | POA: Diagnosis not present

## 2019-12-10 DIAGNOSIS — Z1231 Encounter for screening mammogram for malignant neoplasm of breast: Secondary | ICD-10-CM | POA: Diagnosis not present

## 2020-01-07 DIAGNOSIS — H25811 Combined forms of age-related cataract, right eye: Secondary | ICD-10-CM | POA: Diagnosis not present

## 2020-01-25 IMAGING — US US THYROID
1 series · 13 of 25 positions shown · non-contrast
Comparison: 04/14/2018

CLINICAL DATA: Goiter, previous partial right thyroid lobectomy

EXAM:
THYROID ULTRASOUND
TECHNIQUE: Ultrasound examination of the thyroid gland and adjacent soft
tissues was performed.

[Series 1: us thyroid · 0.07mm/px · 13 of 48 slices shown]
[im 1/48]
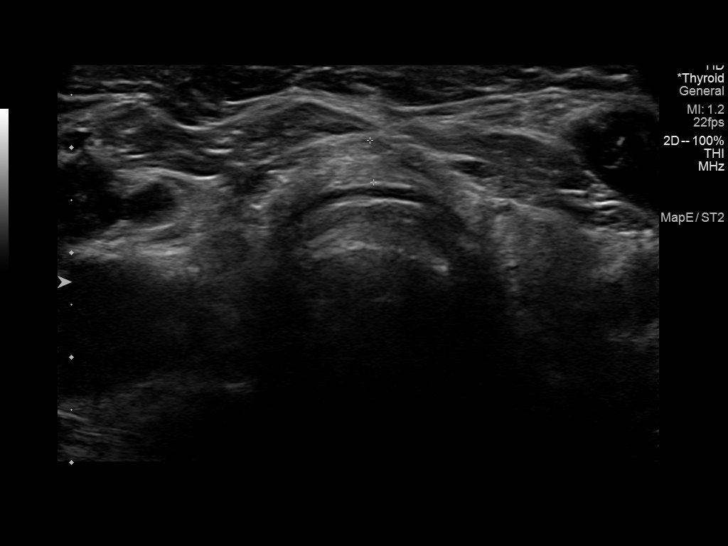
[im 4/48]
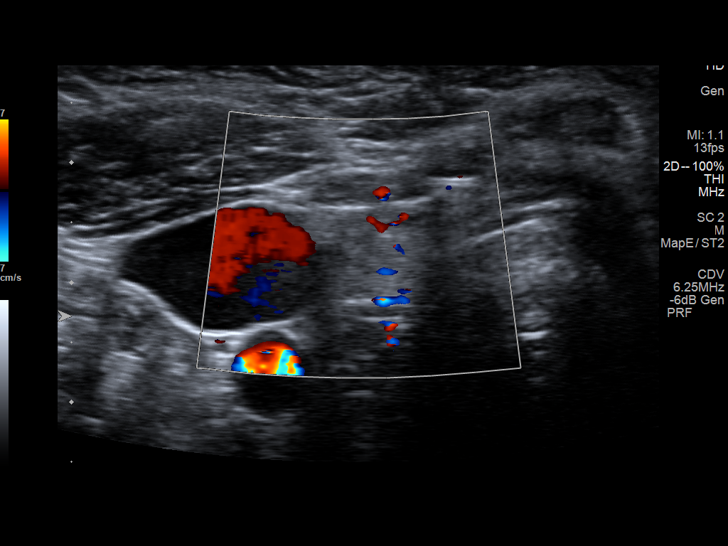
[im 8/48]
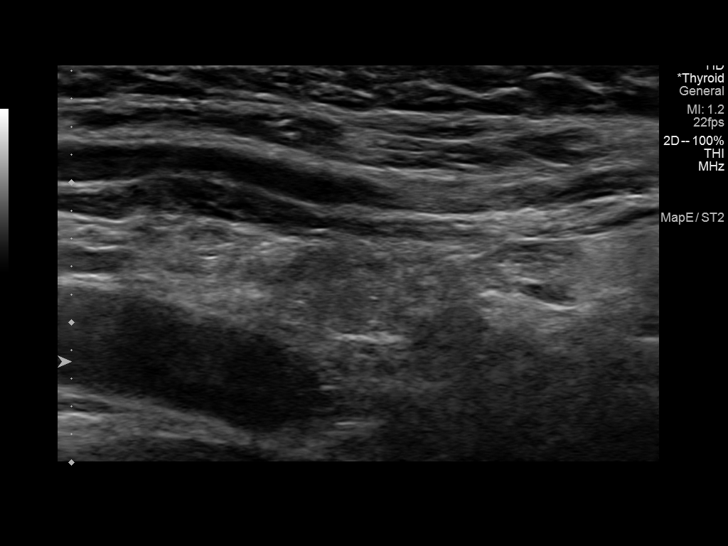
[im 12/48]
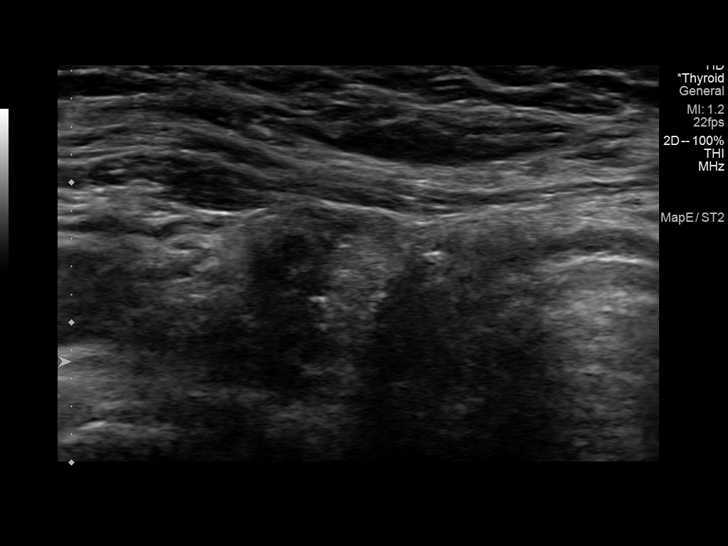
[im 16/48]
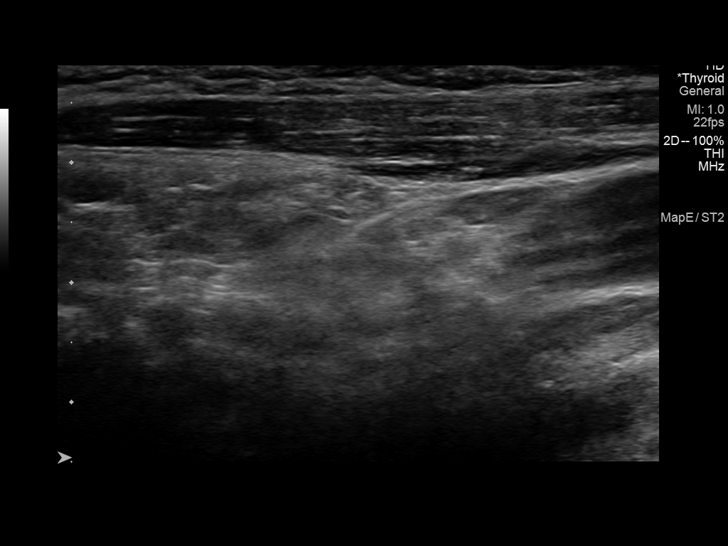
[im 20/48]
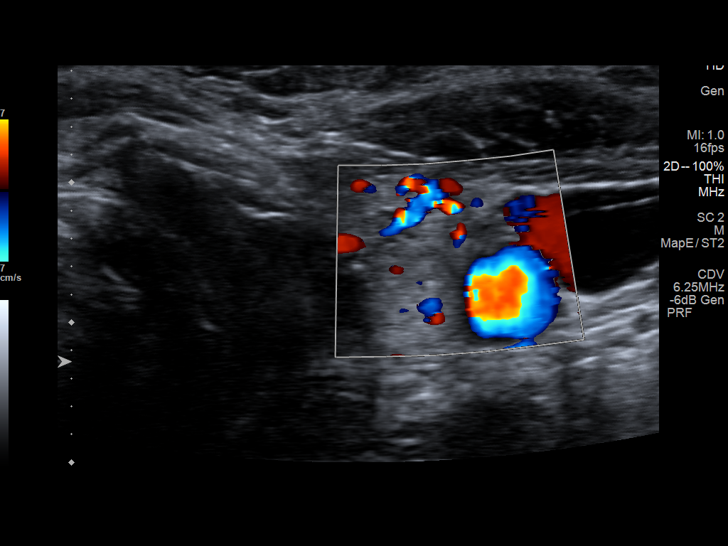
[im 24/48]
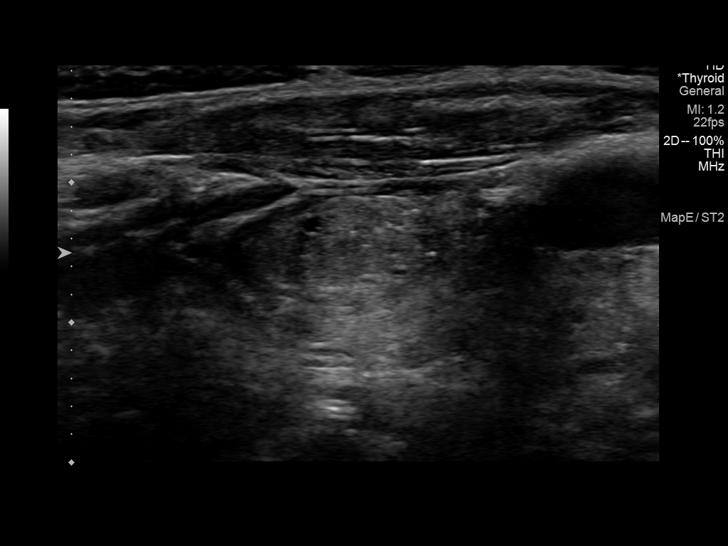
[im 28/48]
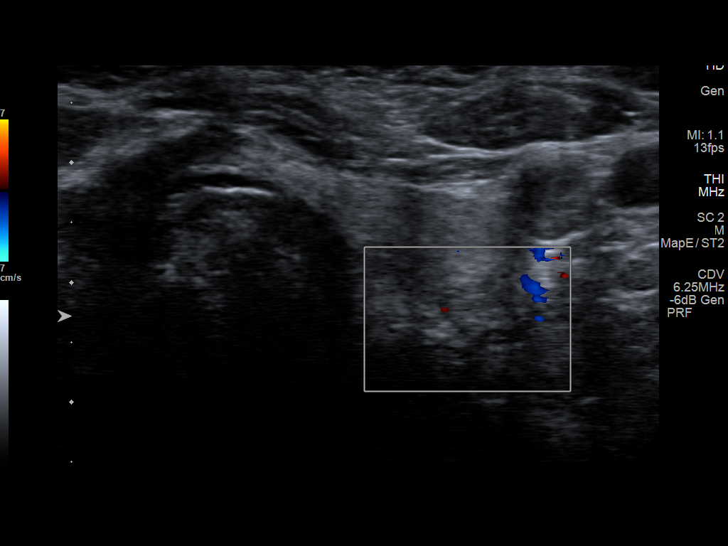
[im 32/48]
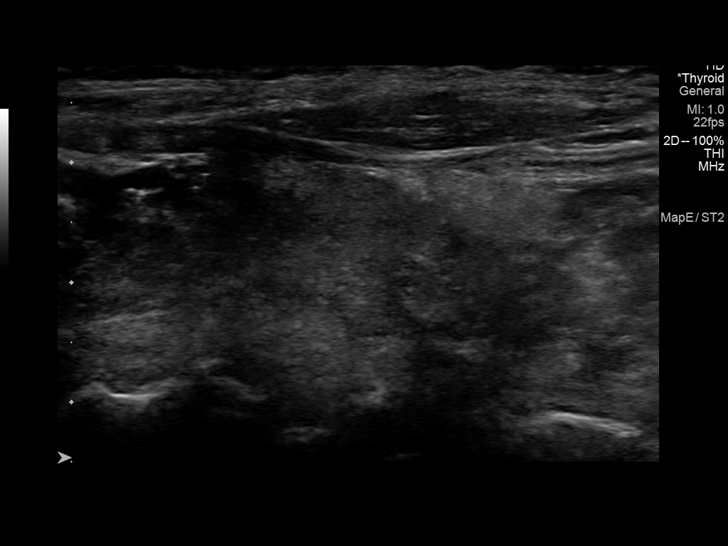
[im 36/48]
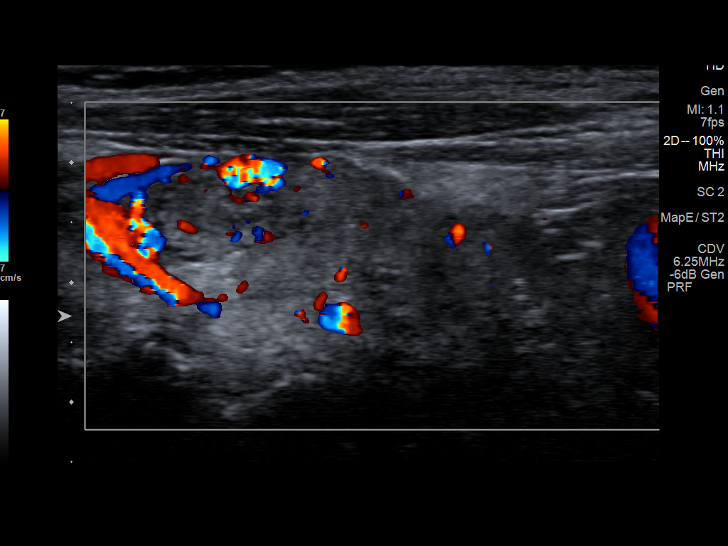
[im 40/48]
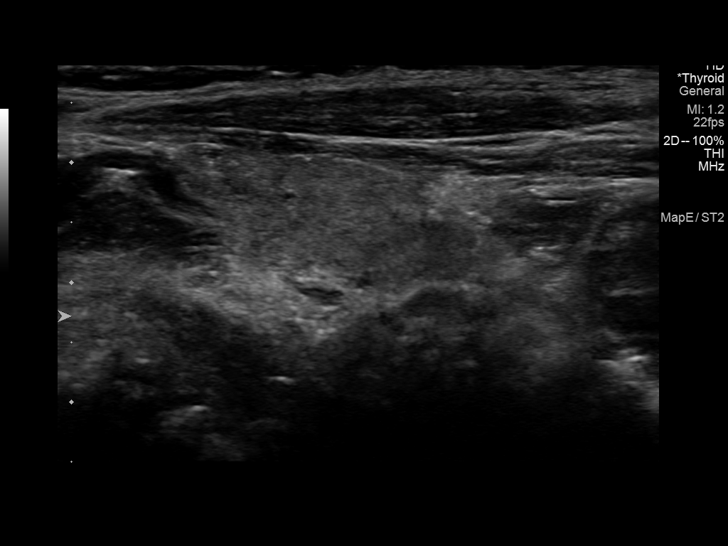
[im 44/48]
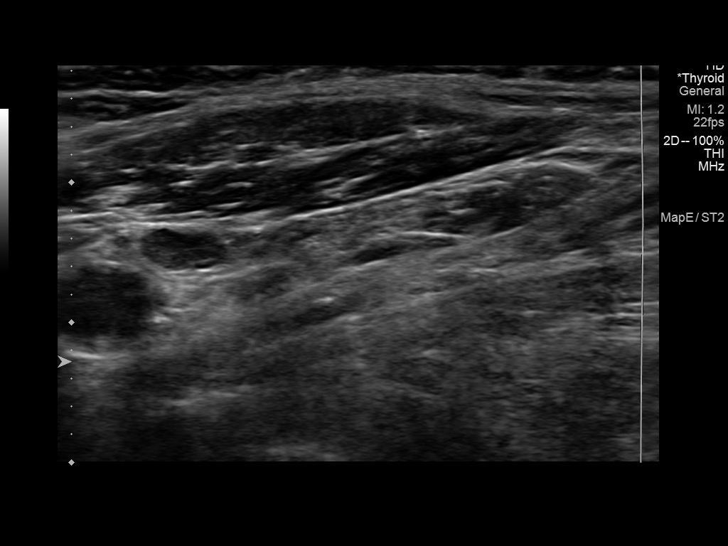
[im 48/48]
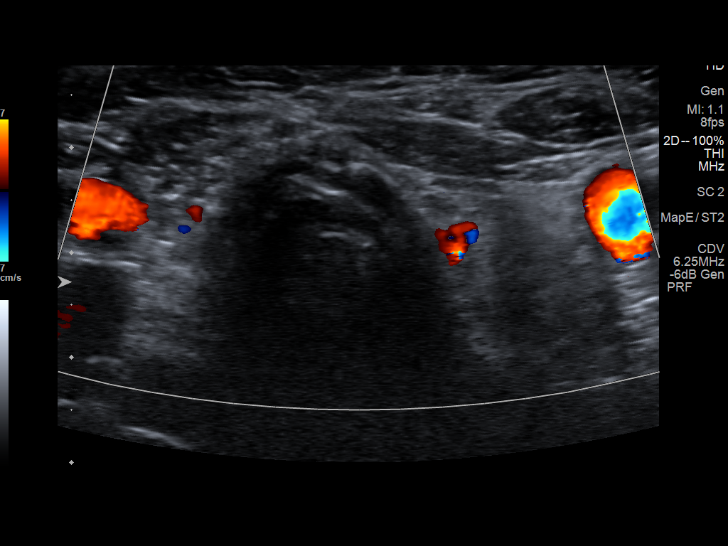

[13 of 25 positions shown; findings below may reference images not displayed]

FINDINGS: Parenchymal Echotexture: Markedly heterogenous

Isthmus: 4 mm

Right lobe: 1.6 x 0.7 x 1.1 cm, previously 1.7 x 0.8 x 1.0 cm

Left lobe:   4.5 x 1.6 x 1.3 cm, previously 4.1 x 1.4 x 1.6 cm

_________________________________________________________

Estimated total number of nodules >/= 1 cm: 2

Number of spongiform nodules >/=  2 cm not described below (TR1): 0

Number of mixed cystic and solid nodules >/= 1.5 cm not described
below (TR2): 0

_________________________________________________________

Stable residual right thyroid lobe parenchyma following partial
right thyroidectomy. No new finding.

Nodule # 1:

Location: Left; Superior

Maximum size: 1.2, previously 1.0 cm; Other 2 dimensions: 0.8 x
cm

Composition: solid/almost completely solid (2)

Echogenicity: isoechoic (1)

Shape: not taller-than-wide (0)

Margins: ill-defined (0)

Echogenic foci: punctate echogenic foci (3)

ACR TI-RADS total points: 6.

ACR TI-RADS risk category: TR4 (4-6 points).

ACR TI-RADS recommendations:

*Given size (>/= 1 - 1.4 cm) and appearance, a follow-up ultrasound
in 1 year should be considered based on TI-RADS criteria.

_________________________________________________________

Nodule # 2:

Location: Left; Inferior

Maximum size: 1.1, previously 0.9 cm; Other 2 dimensions: 0.7 x
cm

Composition: solid/almost completely solid (2)

Echogenicity: hypoechoic (2)

Shape: not taller-than-wide (0)

Margins: ill-defined (0)

Echogenic foci: none (0)

ACR TI-RADS total points: 4.

ACR TI-RADS risk category: TR4 (4-6 points).

ACR TI-RADS recommendations:

*Given size (>/= 1 - 1.4 cm) and appearance, a follow-up ultrasound
in 1 year should be considered based on TI-RADS criteria.

_________________________________________________________

No hypervascularity.  No regional adenopathy.
IMPRESSION: 1.2 cm left superior TR 4 nodule and 1.1 cm left inferior TR 4
nodule. Both meet criteria follow-up in 1 year.

Stable appearance of the right thyroid bed following partial right
thyroidectomy.

No adenopathy

The above is in keeping with the ACR TI-RADS recommendations - [HOSPITAL] [id number];[DATE].

## 2020-02-22 DIAGNOSIS — J4520 Mild intermittent asthma, uncomplicated: Secondary | ICD-10-CM | POA: Diagnosis not present

## 2020-02-22 DIAGNOSIS — I10 Essential (primary) hypertension: Secondary | ICD-10-CM | POA: Diagnosis not present

## 2020-02-22 DIAGNOSIS — K2100 Gastro-esophageal reflux disease with esophagitis, without bleeding: Secondary | ICD-10-CM | POA: Diagnosis not present

## 2020-02-22 DIAGNOSIS — E039 Hypothyroidism, unspecified: Secondary | ICD-10-CM | POA: Diagnosis not present

## 2020-03-13 DIAGNOSIS — J22 Unspecified acute lower respiratory infection: Secondary | ICD-10-CM | POA: Diagnosis not present

## 2020-03-17 DIAGNOSIS — Z20822 Contact with and (suspected) exposure to covid-19: Secondary | ICD-10-CM | POA: Diagnosis not present

## 2020-03-17 DIAGNOSIS — Z03818 Encounter for observation for suspected exposure to other biological agents ruled out: Secondary | ICD-10-CM | POA: Diagnosis not present

## 2020-04-04 DIAGNOSIS — E039 Hypothyroidism, unspecified: Secondary | ICD-10-CM | POA: Diagnosis not present

## 2020-04-10 ENCOUNTER — Other Ambulatory Visit: Payer: Self-pay | Admitting: Endocrinology

## 2020-04-10 DIAGNOSIS — I10 Essential (primary) hypertension: Secondary | ICD-10-CM | POA: Diagnosis not present

## 2020-04-10 DIAGNOSIS — E041 Nontoxic single thyroid nodule: Secondary | ICD-10-CM

## 2020-04-10 DIAGNOSIS — E890 Postprocedural hypothyroidism: Secondary | ICD-10-CM | POA: Diagnosis not present

## 2020-04-10 DIAGNOSIS — E039 Hypothyroidism, unspecified: Secondary | ICD-10-CM | POA: Diagnosis not present

## 2020-04-12 DIAGNOSIS — H2512 Age-related nuclear cataract, left eye: Secondary | ICD-10-CM | POA: Diagnosis not present

## 2020-04-14 DIAGNOSIS — H25812 Combined forms of age-related cataract, left eye: Secondary | ICD-10-CM | POA: Diagnosis not present

## 2020-04-14 DIAGNOSIS — H2511 Age-related nuclear cataract, right eye: Secondary | ICD-10-CM | POA: Diagnosis not present

## 2020-04-20 ENCOUNTER — Ambulatory Visit
Admission: RE | Admit: 2020-04-20 | Discharge: 2020-04-20 | Disposition: A | Discharge status: Home / Self Care | Payer: Medicare HMO | Source: Ambulatory Visit | Attending: Endocrinology | Admitting: Endocrinology

## 2020-04-20 DIAGNOSIS — E041 Nontoxic single thyroid nodule: Secondary | ICD-10-CM

## 2020-06-14 DIAGNOSIS — H5213 Myopia, bilateral: Secondary | ICD-10-CM | POA: Diagnosis not present

## 2020-06-14 DIAGNOSIS — H52209 Unspecified astigmatism, unspecified eye: Secondary | ICD-10-CM | POA: Diagnosis not present

## 2020-06-14 DIAGNOSIS — H524 Presbyopia: Secondary | ICD-10-CM | POA: Diagnosis not present

## 2020-08-31 DIAGNOSIS — E039 Hypothyroidism, unspecified: Secondary | ICD-10-CM | POA: Diagnosis not present

## 2020-08-31 DIAGNOSIS — I10 Essential (primary) hypertension: Secondary | ICD-10-CM | POA: Diagnosis not present

## 2020-09-04 DIAGNOSIS — E039 Hypothyroidism, unspecified: Secondary | ICD-10-CM | POA: Diagnosis not present

## 2020-09-04 DIAGNOSIS — Z0000 Encounter for general adult medical examination without abnormal findings: Secondary | ICD-10-CM | POA: Diagnosis not present

## 2020-09-04 DIAGNOSIS — I10 Essential (primary) hypertension: Secondary | ICD-10-CM | POA: Diagnosis not present

## 2020-09-04 DIAGNOSIS — K2100 Gastro-esophageal reflux disease with esophagitis, without bleeding: Secondary | ICD-10-CM | POA: Diagnosis not present

## 2020-09-04 DIAGNOSIS — N3281 Overactive bladder: Secondary | ICD-10-CM | POA: Diagnosis not present

## 2020-09-04 DIAGNOSIS — J4520 Mild intermittent asthma, uncomplicated: Secondary | ICD-10-CM | POA: Diagnosis not present

## 2020-09-26 DIAGNOSIS — Z853 Personal history of malignant neoplasm of breast: Secondary | ICD-10-CM | POA: Diagnosis not present

## 2020-09-26 DIAGNOSIS — Z9884 Bariatric surgery status: Secondary | ICD-10-CM | POA: Diagnosis not present

## 2020-09-26 DIAGNOSIS — Z800 Family history of malignant neoplasm of digestive organs: Secondary | ICD-10-CM | POA: Diagnosis not present

## 2020-09-26 DIAGNOSIS — R1013 Epigastric pain: Secondary | ICD-10-CM | POA: Diagnosis not present

## 2020-09-26 DIAGNOSIS — Z8509 Personal history of malignant neoplasm of other digestive organs: Secondary | ICD-10-CM | POA: Diagnosis not present

## 2020-09-26 DIAGNOSIS — Z8571 Personal history of Hodgkin lymphoma: Secondary | ICD-10-CM | POA: Diagnosis not present

## 2020-11-10 DIAGNOSIS — Z01812 Encounter for preprocedural laboratory examination: Secondary | ICD-10-CM | POA: Diagnosis not present

## 2020-11-15 DIAGNOSIS — K2930 Chronic superficial gastritis without bleeding: Secondary | ICD-10-CM | POA: Diagnosis not present

## 2020-11-15 DIAGNOSIS — R1013 Epigastric pain: Secondary | ICD-10-CM | POA: Diagnosis not present

## 2020-11-15 DIAGNOSIS — K5730 Diverticulosis of large intestine without perforation or abscess without bleeding: Secondary | ICD-10-CM | POA: Diagnosis not present

## 2020-11-15 DIAGNOSIS — K635 Polyp of colon: Secondary | ICD-10-CM | POA: Diagnosis not present

## 2020-11-15 DIAGNOSIS — D122 Benign neoplasm of ascending colon: Secondary | ICD-10-CM | POA: Diagnosis not present

## 2020-11-15 DIAGNOSIS — K317 Polyp of stomach and duodenum: Secondary | ICD-10-CM | POA: Diagnosis not present

## 2020-11-15 DIAGNOSIS — Z1211 Encounter for screening for malignant neoplasm of colon: Secondary | ICD-10-CM | POA: Diagnosis not present

## 2020-11-15 DIAGNOSIS — K2100 Gastro-esophageal reflux disease with esophagitis, without bleeding: Secondary | ICD-10-CM | POA: Diagnosis not present

## 2020-11-15 DIAGNOSIS — K3189 Other diseases of stomach and duodenum: Secondary | ICD-10-CM | POA: Diagnosis not present

## 2020-11-15 DIAGNOSIS — D124 Benign neoplasm of descending colon: Secondary | ICD-10-CM | POA: Diagnosis not present

## 2020-11-21 DIAGNOSIS — K2930 Chronic superficial gastritis without bleeding: Secondary | ICD-10-CM | POA: Diagnosis not present

## 2020-11-21 DIAGNOSIS — K635 Polyp of colon: Secondary | ICD-10-CM | POA: Diagnosis not present

## 2020-11-21 DIAGNOSIS — D122 Benign neoplasm of ascending colon: Secondary | ICD-10-CM | POA: Diagnosis not present

## 2020-11-21 DIAGNOSIS — Z961 Presence of intraocular lens: Secondary | ICD-10-CM | POA: Diagnosis not present

## 2020-11-21 DIAGNOSIS — D124 Benign neoplasm of descending colon: Secondary | ICD-10-CM | POA: Diagnosis not present

## 2020-11-21 DIAGNOSIS — H40013 Open angle with borderline findings, low risk, bilateral: Secondary | ICD-10-CM | POA: Diagnosis not present

## 2020-11-21 DIAGNOSIS — H26493 Other secondary cataract, bilateral: Secondary | ICD-10-CM | POA: Diagnosis not present

## 2020-11-21 DIAGNOSIS — H04123 Dry eye syndrome of bilateral lacrimal glands: Secondary | ICD-10-CM | POA: Diagnosis not present

## 2020-12-15 DIAGNOSIS — Z1231 Encounter for screening mammogram for malignant neoplasm of breast: Secondary | ICD-10-CM | POA: Diagnosis not present

## 2021-01-10 DIAGNOSIS — M722 Plantar fascial fibromatosis: Secondary | ICD-10-CM | POA: Diagnosis not present

## 2021-01-10 DIAGNOSIS — E039 Hypothyroidism, unspecified: Secondary | ICD-10-CM | POA: Diagnosis not present

## 2021-01-10 DIAGNOSIS — R350 Frequency of micturition: Secondary | ICD-10-CM | POA: Diagnosis not present

## 2021-01-10 DIAGNOSIS — J301 Allergic rhinitis due to pollen: Secondary | ICD-10-CM | POA: Diagnosis not present

## 2021-01-10 DIAGNOSIS — J4520 Mild intermittent asthma, uncomplicated: Secondary | ICD-10-CM | POA: Diagnosis not present

## 2021-01-10 DIAGNOSIS — I10 Essential (primary) hypertension: Secondary | ICD-10-CM | POA: Diagnosis not present

## 2021-01-29 DIAGNOSIS — Z20822 Contact with and (suspected) exposure to covid-19: Secondary | ICD-10-CM | POA: Diagnosis not present

## 2021-03-08 DIAGNOSIS — R059 Cough, unspecified: Secondary | ICD-10-CM | POA: Diagnosis not present

## 2021-03-08 DIAGNOSIS — J45909 Unspecified asthma, uncomplicated: Secondary | ICD-10-CM | POA: Diagnosis not present
